# Patient Record
Sex: Female | Born: 1970
Health system: Southern US, Community
[De-identification: ages and names within clinical notes are randomized; demographics above are authoritative.]

## PROBLEM LIST (undated history)

## (undated) ENCOUNTER — Inpatient Hospital Stay (HOSPITAL_COMMUNITY): Payer: Self-pay

## (undated) DIAGNOSIS — E049 Nontoxic goiter, unspecified: Secondary | ICD-10-CM

## (undated) DIAGNOSIS — L910 Hypertrophic scar: Secondary | ICD-10-CM

## (undated) DIAGNOSIS — O139 Gestational [pregnancy-induced] hypertension without significant proteinuria, unspecified trimester: Secondary | ICD-10-CM

## (undated) DIAGNOSIS — B977 Papillomavirus as the cause of diseases classified elsewhere: Secondary | ICD-10-CM

## (undated) DIAGNOSIS — N390 Urinary tract infection, site not specified: Secondary | ICD-10-CM

## (undated) HISTORY — DX: Papillomavirus as the cause of diseases classified elsewhere: B97.7

---

## 2007-01-06 DIAGNOSIS — B977 Papillomavirus as the cause of diseases classified elsewhere: Secondary | ICD-10-CM

## 2007-01-06 HISTORY — DX: Papillomavirus as the cause of diseases classified elsewhere: B97.7

## 2009-12-17 ENCOUNTER — Emergency Department: Payer: Self-pay | Admitting: Emergency Medicine

## 2010-02-06 ENCOUNTER — Other Ambulatory Visit: Payer: Self-pay | Admitting: Otolaryngology

## 2010-02-06 DIAGNOSIS — E049 Nontoxic goiter, unspecified: Secondary | ICD-10-CM

## 2010-02-10 ENCOUNTER — Ambulatory Visit
Admission: RE | Admit: 2010-02-10 | Discharge: 2010-02-10 | Disposition: A | Discharge status: Home / Self Care | Payer: Managed Care, Other (non HMO) | Source: Ambulatory Visit | Attending: Otolaryngology | Admitting: Otolaryngology

## 2010-02-10 DIAGNOSIS — E049 Nontoxic goiter, unspecified: Secondary | ICD-10-CM

## 2010-02-10 MED ORDER — IOHEXOL 300 MG/ML  SOLN
75.0000 mL | Freq: Once | INTRAMUSCULAR | Status: AC | PRN
  Administered 2010-02-10: 75 mL via INTRAVENOUS

## 2010-07-10 ENCOUNTER — Inpatient Hospital Stay (INDEPENDENT_AMBULATORY_CARE_PROVIDER_SITE_OTHER)
Admission: RE | Admit: 2010-07-10 | Discharge: 2010-07-10 | Disposition: A | Discharge status: Home / Self Care | Payer: Managed Care, Other (non HMO) | Source: Ambulatory Visit | Attending: Family Medicine | Admitting: Family Medicine

## 2010-07-10 DIAGNOSIS — K219 Gastro-esophageal reflux disease without esophagitis: Secondary | ICD-10-CM

## 2011-01-19 ENCOUNTER — Ambulatory Visit: Payer: Managed Care, Other (non HMO) | Admitting: Family Medicine

## 2011-01-27 ENCOUNTER — Inpatient Hospital Stay (HOSPITAL_COMMUNITY)
Admission: AD | Admit: 2011-01-27 | Discharge: 2011-01-27 | Disposition: A | Discharge status: Home / Self Care | Payer: BC Managed Care – PPO | Source: Ambulatory Visit | Attending: Obstetrics & Gynecology | Admitting: Obstetrics & Gynecology

## 2011-01-27 ENCOUNTER — Inpatient Hospital Stay (HOSPITAL_COMMUNITY): Payer: BC Managed Care – PPO

## 2011-01-27 ENCOUNTER — Encounter (HOSPITAL_COMMUNITY): Payer: Self-pay | Admitting: *Deleted

## 2011-01-27 DIAGNOSIS — O209 Hemorrhage in early pregnancy, unspecified: Secondary | ICD-10-CM | POA: Insufficient documentation

## 2011-01-27 DIAGNOSIS — N760 Acute vaginitis: Secondary | ICD-10-CM | POA: Insufficient documentation

## 2011-01-27 DIAGNOSIS — O4690 Antepartum hemorrhage, unspecified, unspecified trimester: Secondary | ICD-10-CM

## 2011-01-27 DIAGNOSIS — O2390 Unspecified genitourinary tract infection in pregnancy, unspecified trimester: Secondary | ICD-10-CM | POA: Insufficient documentation

## 2011-01-27 DIAGNOSIS — B9689 Other specified bacterial agents as the cause of diseases classified elsewhere: Secondary | ICD-10-CM | POA: Insufficient documentation

## 2011-01-27 DIAGNOSIS — A499 Bacterial infection, unspecified: Secondary | ICD-10-CM | POA: Insufficient documentation

## 2011-01-27 HISTORY — DX: Nontoxic goiter, unspecified: E04.9

## 2011-01-27 HISTORY — DX: Hypertrophic scar: L91.0

## 2011-01-27 HISTORY — DX: Gestational (pregnancy-induced) hypertension without significant proteinuria, unspecified trimester: O13.9

## 2011-01-27 HISTORY — DX: Urinary tract infection, site not specified: N39.0

## 2011-01-27 LAB — WET PREP, GENITAL: Trich, Wet Prep: NONE SEEN

## 2011-01-27 LAB — URINALYSIS, ROUTINE W REFLEX MICROSCOPIC
Bilirubin Urine: NEGATIVE
Glucose, UA: NEGATIVE mg/dL
Ketones, ur: NEGATIVE mg/dL
Leukocytes, UA: NEGATIVE
Protein, ur: NEGATIVE mg/dL
pH: 6 (ref 5.0–8.0)

## 2011-01-27 LAB — ABO/RH: ABO/RH(D): O POS

## 2011-01-27 LAB — CBC
Hemoglobin: 12.3 g/dL (ref 12.0–15.0)
MCH: 26.8 pg (ref 26.0–34.0)
MCV: 82.8 fL (ref 78.0–100.0)
RBC: 4.59 MIL/uL (ref 3.87–5.11)
WBC: 6.3 10*3/uL (ref 4.0–10.5)

## 2011-01-27 LAB — URINE MICROSCOPIC-ADD ON

## 2011-01-27 MED ORDER — METRONIDAZOLE 500 MG PO TABS: 500.0000 mg | ORAL_TABLET | Freq: Two times a day (BID) | ORAL | Status: AC

## 2011-01-27 NOTE — Progress Notes (Signed)
+  HPT 01/13, spotting on the 12th , light pink spotting today, cramping this morning.

## 2011-01-27 NOTE — Progress Notes (Signed)
Pt states, " My last period was Dec 5th, and this morning I was cramping, but they have stopped now. I had light pink spotting at 5:00 am, but haven't seen it since. I had a positive home pregnancy test on Jan 18, 2011."

## 2011-01-27 NOTE — ED Provider Notes (Addendum)
History   Patient is a 41 year old, pregnant, G43P1102 female who presents to the MAU with chief complaint of spotting and abdominal cramping x 1 day.  Spotting and cramping began this morning at 5:00am.  Patient notices the spotting after wiping following urination.  She is not bleeding currently.  She was last sexually active 3 days ago.  The cramping is intermittent and located in her periumbilical region.  A warm shower improved her cramps this morning; she has not taken any pain relieving medications.  She has no history of irregular menstrual or vaginal bleeding, abnormal pap smears, or STIs.  She denies chest pain, shortness of breath, vomiting, dysuria, hematuria, diarrhea and blood in her stool.   Chief Complaint  Patient presents with  . Vaginal Bleeding  . Abdominal Cramping   HPI  Past Medical History  Diagnosis Date  . Pregnancy induced hypertension   . Goiter   . Urinary tract infection   . Keloid     Past Surgical History  Procedure Date  . Cesarean section     Family History  Problem Relation Age of Onset  . Anesthesia problems Neg Hx     History  Substance Use Topics  . Smoking status: Never Smoker   . Smokeless tobacco: Never Used  . Alcohol Use: No    Allergies: No Known Allergies  No prescriptions prior to admission    Review of Systems  Constitutional: Positive for chills and malaise/fatigue. Negative for fever.  Respiratory: Negative for shortness of breath.   Cardiovascular: Negative for chest pain.  Gastrointestinal: Positive for nausea and abdominal pain (periumbilical). Negative for vomiting, diarrhea and blood in stool.  Genitourinary: Negative for dysuria and hematuria.  Musculoskeletal: Negative for back pain.   Physical Exam   Blood pressure 117/67, pulse 83, temperature 98.4 F (36.9 C), temperature source Oral, resp. rate 18, height 5' 4.5" (1.638 m), weight 119.806 kg (264 lb 2 oz), last menstrual period 12/10/2010.  Physical Exam    Constitutional: She is oriented to person, place, and time. She appears well-developed and well-nourished.  HENT:  Head: Normocephalic and atraumatic.  Cardiovascular: Normal rate, regular rhythm and normal heart sounds.   No murmur heard. Respiratory: No respiratory distress. She has no wheezes.  GI: Soft. Bowel sounds are normal. She exhibits no mass. There is tenderness (mild periumbilical). There is no rebound and no guarding.  Neurological: She is alert and oriented to person, place, and time.  Psychiatric: She has a normal mood and affect. Her behavior is normal.   Results for orders placed during the hospital encounter of 01/27/11 (from the past 24 hour(s))  URINALYSIS, ROUTINE W REFLEX MICROSCOPIC     Status: Abnormal   Collection Time   01/27/11 11:50 AM      Component Value Range   Color, Urine YELLOW  YELLOW    APPearance CLEAR  CLEAR    Specific Gravity, Urine 1.025  1.005 - 1.030    pH 6.0  5.0 - 8.0    Glucose, UA NEGATIVE  NEGATIVE (mg/dL)   Hgb urine dipstick SMALL (*) NEGATIVE    Bilirubin Urine NEGATIVE  NEGATIVE    Ketones, ur NEGATIVE  NEGATIVE (mg/dL)   Protein, ur NEGATIVE  NEGATIVE (mg/dL)   Urobilinogen, UA 0.2  0.0 - 1.0 (mg/dL)   Nitrite NEGATIVE  NEGATIVE    Leukocytes, UA NEGATIVE  NEGATIVE   URINE MICROSCOPIC-ADD ON     Status: Abnormal   Collection Time   01/27/11 11:50  AM      Component Value Range   Squamous Epithelial / LPF FEW (*) RARE    RBC / HPF 3-6  <3 (RBC/hpf)  POCT PREGNANCY, URINE     Status: Normal   Collection Time   01/27/11 12:16 PM      Component Value Range   Preg Test, Ur POSITIVE    HCG, QUANTITATIVE, PREGNANCY     Status: Abnormal   Collection Time   01/27/11 12:40 PM      Component Value Range   hCG, Beta Chain, Quant, S 82956 (*) <5 (mIU/mL)  CBC     Status: Normal   Collection Time   01/27/11 12:41 PM      Component Value Range   WBC 6.3  4.0 - 10.5 (K/uL)   RBC 4.59  3.87 - 5.11 (MIL/uL)   Hemoglobin 12.3  12.0 -  15.0 (g/dL)   HCT 21.3  08.6 - 57.8 (%)   MCV 82.8  78.0 - 100.0 (fL)   MCH 26.8  26.0 - 34.0 (pg)   MCHC 32.4  30.0 - 36.0 (g/dL)   RDW 46.9  62.9 - 52.8 (%)   Platelets 268  150 - 400 (K/uL)  ABO/RH     Status: Normal   Collection Time   01/27/11 12:41 PM      Component Value Range   ABO/RH(D) O POS    WET PREP, GENITAL     Status: Abnormal   Collection Time   01/27/11  1:30 PM      Component Value Range   Yeast, Wet Prep NONE SEEN  NONE SEEN    Trich, Wet Prep NONE SEEN  NONE SEEN    Clue Cells, Wet Prep FEW (*) NONE SEEN    WBC, Wet Prep HPF POC MANY (*) NONE SEEN      MAU Course  Procedures U/A Pelvic exam with wet prep, GC/Chlamydia U/S  Assessment and Plan  I sign over this patient's care to Sharen Counter, CNM, at 1:31pm on January 27, 2011.    Derek Jack 01/27/2011, 1:22 PM   A: Vaginal bleeding of pregnancy Bacterial vaginosis  P: D/C home with bleeding precautions Pt has scheduled prenatal visit in 1 week in [place].  Prescription for Flagyl 500mg  BID for 7 days #14 no refills called to [facility] [address] [phone]  Sharen Counter, CNM

## 2011-01-28 ENCOUNTER — Telehealth: Payer: Self-pay | Admitting: *Deleted

## 2011-01-28 DIAGNOSIS — B9689 Other specified bacterial agents as the cause of diseases classified elsewhere: Secondary | ICD-10-CM

## 2011-01-28 DIAGNOSIS — N760 Acute vaginitis: Secondary | ICD-10-CM

## 2011-01-28 MED ORDER — METRONIDAZOLE 0.75 % VA GEL: 1.0000 | Freq: Every day | VAGINAL | Status: AC

## 2011-01-28 MED ORDER — METRONIDAZOLE 0.75 % VA GEL: 1.0000 | Freq: Every day | VAGINAL | Status: DC

## 2011-01-28 NOTE — Telephone Encounter (Signed)
Patient unable to take metronidazole, it made her very nauseated and dizzy.  We will call in Metrogel Vaginal to treat bacterial infection.

## 2011-01-28 NOTE — Telephone Encounter (Signed)
Patient changed her mind and would like [facility] [place].

## 2011-01-29 ENCOUNTER — Ambulatory Visit (INDEPENDENT_AMBULATORY_CARE_PROVIDER_SITE_OTHER): Payer: BC Managed Care – PPO | Admitting: Family Medicine

## 2011-01-29 VITALS — BP 131/81 | Wt 268.0 lb

## 2011-01-29 DIAGNOSIS — O200 Threatened abortion: Secondary | ICD-10-CM

## 2011-01-29 NOTE — Progress Notes (Signed)
Patient is here for New OB intake she was seen at MAU for spotting and was told there was a small hemorrhage.  She has had some more light spotting today, only when she wipes.  On ultrasound I am unable to clearly see a fetal pole.  Today I included HCG quant on her labs and because she has been spotting off and on I advised her of what to do if she begins to bleed more heavily.  She understands and will call us Monday for results.  She has been feeling cramping and nausea so I have given her a note for work until Monday at which point we will re evaluate her pregnancy status.  She had a quant drawn at the hospital so we will be able to see if the numbers are moving up or down.  She is interested in having her tubes tied if this pregnancy fails.

## 2011-01-30 LAB — OBSTETRIC PANEL
Antibody Screen: NEGATIVE
Basophils Absolute: 0 10*3/uL (ref 0.0–0.1)
Basophils Relative: 0 % (ref 0–1)
Eosinophils Absolute: 0 10*3/uL (ref 0.0–0.7)
Eosinophils Relative: 0 % (ref 0–5)
HCT: 35.6 % — ABNORMAL LOW (ref 36.0–46.0)
Hemoglobin: 12 g/dL (ref 12.0–15.0)
MCH: 27.8 pg (ref 26.0–34.0)
MCHC: 33.7 g/dL (ref 30.0–36.0)
Monocytes Absolute: 0.5 10*3/uL (ref 0.1–1.0)
Monocytes Relative: 7 % (ref 3–12)
Neutro Abs: 3.6 10*3/uL (ref 1.7–7.7)
RDW: 14.6 % (ref 11.5–15.5)
Rh Type: POSITIVE

## 2011-01-30 LAB — HIV ANTIBODY (ROUTINE TESTING W REFLEX): HIV: NONREACTIVE

## 2011-01-30 LAB — HCG, QUANTITATIVE, PREGNANCY: hCG, Beta Chain, Quant, S: 55290.8 m[IU]/mL

## 2011-02-02 ENCOUNTER — Telehealth: Payer: Self-pay | Admitting: *Deleted

## 2011-02-02 DIAGNOSIS — O200 Threatened abortion: Secondary | ICD-10-CM

## 2011-02-02 NOTE — Telephone Encounter (Signed)
Ultrasound per Dr. Penne Lash.

## 2011-02-03 ENCOUNTER — Ambulatory Visit (HOSPITAL_COMMUNITY)
Admission: RE | Admit: 2011-02-03 | Discharge: 2011-02-03 | Disposition: A | Discharge status: Home / Self Care | Payer: BC Managed Care – PPO | Source: Ambulatory Visit | Attending: Obstetrics and Gynecology | Admitting: Obstetrics and Gynecology

## 2011-02-03 ENCOUNTER — Encounter: Payer: Self-pay | Admitting: Obstetrics and Gynecology

## 2011-02-03 DIAGNOSIS — Z8751 Personal history of pre-term labor: Secondary | ICD-10-CM | POA: Insufficient documentation

## 2011-02-03 DIAGNOSIS — O34219 Maternal care for unspecified type scar from previous cesarean delivery: Secondary | ICD-10-CM | POA: Insufficient documentation

## 2011-02-03 DIAGNOSIS — O021 Missed abortion: Secondary | ICD-10-CM | POA: Insufficient documentation

## 2011-02-03 DIAGNOSIS — E669 Obesity, unspecified: Secondary | ICD-10-CM | POA: Insufficient documentation

## 2011-02-03 DIAGNOSIS — O99210 Obesity complicating pregnancy, unspecified trimester: Secondary | ICD-10-CM | POA: Insufficient documentation

## 2011-02-03 DIAGNOSIS — O200 Threatened abortion: Secondary | ICD-10-CM

## 2011-02-03 DIAGNOSIS — O209 Hemorrhage in early pregnancy, unspecified: Secondary | ICD-10-CM | POA: Insufficient documentation

## 2011-02-03 NOTE — Progress Notes (Signed)
Called by radiology to inform patient of missed Ab. Patient reports experiencing vaginal spotting and cramping since 01/27/2011. Results of ultrasound reviewed which is suggestive of a missed Ab. Options of expectant vs medical management explained to the patient. Patient opted for medical management. Rx 1000 mcg Cytotec per vagina at bedtime was provided. Patient advised to return to [facility] office on Monday for f/u quant HCG or if she did not experience any vaginal bleeding, to be scheduled for D&C.  Patient verbalized understanding and all questions were answered. Patient also received  Rx 20 tab of percocet (5/325mg )

## 2011-02-09 ENCOUNTER — Ambulatory Visit (INDEPENDENT_AMBULATORY_CARE_PROVIDER_SITE_OTHER): Payer: BC Managed Care – PPO | Admitting: Obstetrics & Gynecology

## 2011-02-09 ENCOUNTER — Encounter: Payer: Self-pay | Admitting: Obstetrics & Gynecology

## 2011-02-09 VITALS — BP 139/91 | HR 76 | Ht 65.0 in | Wt 269.0 lb

## 2011-02-09 DIAGNOSIS — O021 Missed abortion: Secondary | ICD-10-CM

## 2011-02-09 MED ORDER — OXYCODONE-ACETAMINOPHEN 5-325 MG PO TABS: 1.0000 | ORAL_TABLET | Freq: Four times a day (QID) | ORAL | Status: AC | PRN

## 2011-02-09 NOTE — Progress Notes (Signed)
History:  41 y.o. [id number] here today for follow up of missed abortion that was managed medically on 02/03/11.  Reports passing the pregnancy with a lot of blood, she is wondering if she is anemia.  Denies LH, dizziness or other symptoms.  Still has light bleeding which is expected and severe cramping not alleviated by Ibuprofen, she desires Percocet Rx. No sexual intercourse yet as recommended, undecided about birth control.  Last pap was over 3 years ago.  The following portions of the patient's history were reviewed and updated as appropriate: allergies, current medications, past family history, past medical history, past social history, past surgical history and problem list.   Objective:  Physical Exam Blood pressure 139/91, pulse 76, height 5\' 5"  (1.651 m), weight 269 lb (122.018 kg), last menstrual period 12/10/2010, unknown if currently breastfeeding. Gen: NAD Abd: Soft, nontender and non-distended, obese Pelvic: Deferred   Assessment & Plan:  MAB, medically managed, completed as per patient report.  Will check BHCG and CBC and manage appropriately. Percocet Rx given to patient. Schedule for annual exam soon. Reviewed all forms of birth control options available including abstinence; over the counter methods; oral contraceptive medication including pill, patch, or ring; Depo-Provera injection; Mirena and Paragard IUDs; permanent sterilization options including vasectomy, tubal ligation, or tubal occlusion using Essure coils. Risks and benefits reviewed.  Questions were answered.  Information was given to patient to review. Patient still undecided,  will re-discuss BCM at next visit.

## 2011-02-10 LAB — CBC
MCV: 83.8 fL (ref 78.0–100.0)
Platelets: 292 10*3/uL (ref 150–400)
RBC: 4.2 MIL/uL (ref 3.87–5.11)
RDW: 14.6 % (ref 11.5–15.5)
WBC: 6.3 10*3/uL (ref 4.0–10.5)

## 2011-02-10 LAB — HCG, QUANTITATIVE, PREGNANCY: hCG, Beta Chain, Quant, S: 1464.2 m[IU]/mL

## 2011-02-12 ENCOUNTER — Ambulatory Visit: Payer: Managed Care, Other (non HMO) | Admitting: Family Medicine

## 2011-02-18 ENCOUNTER — Other Ambulatory Visit (INDEPENDENT_AMBULATORY_CARE_PROVIDER_SITE_OTHER): Payer: BC Managed Care – PPO

## 2011-02-18 DIAGNOSIS — O021 Missed abortion: Secondary | ICD-10-CM

## 2011-02-19 LAB — HCG, QUANTITATIVE, PREGNANCY: hCG, Beta Chain, Quant, S: 288.4 m[IU]/mL

## 2011-03-04 ENCOUNTER — Other Ambulatory Visit: Payer: BC Managed Care – PPO

## 2011-03-04 ENCOUNTER — Other Ambulatory Visit (INDEPENDENT_AMBULATORY_CARE_PROVIDER_SITE_OTHER): Payer: BC Managed Care – PPO | Admitting: *Deleted

## 2011-03-04 DIAGNOSIS — O021 Missed abortion: Secondary | ICD-10-CM

## 2011-03-05 LAB — HCG, QUANTITATIVE, PREGNANCY: hCG, Beta Chain, Quant, S: 12.4 m[IU]/mL

## 2011-03-16 ENCOUNTER — Other Ambulatory Visit (INDEPENDENT_AMBULATORY_CARE_PROVIDER_SITE_OTHER): Payer: BC Managed Care – PPO | Admitting: *Deleted

## 2011-03-16 DIAGNOSIS — O021 Missed abortion: Secondary | ICD-10-CM

## 2011-03-16 NOTE — Progress Notes (Signed)
Patient is here for HCG quant due to missed ab.

## 2011-03-18 LAB — HCG, QUANTITATIVE, PREGNANCY: hCG, Beta Chain, Quant, S: 2.6 m[IU]/mL

## 2011-06-25 ENCOUNTER — Ambulatory Visit (INDEPENDENT_AMBULATORY_CARE_PROVIDER_SITE_OTHER): Payer: BC Managed Care – PPO | Admitting: Family Medicine

## 2011-06-25 ENCOUNTER — Encounter: Payer: Self-pay | Admitting: Family Medicine

## 2011-06-25 VITALS — BP 120/70 | HR 80 | Temp 98.0°F | Ht 64.5 in | Wt 263.0 lb

## 2011-06-25 DIAGNOSIS — E049 Nontoxic goiter, unspecified: Secondary | ICD-10-CM

## 2011-06-25 DIAGNOSIS — E669 Obesity, unspecified: Secondary | ICD-10-CM

## 2011-06-25 DIAGNOSIS — Z0000 Encounter for general adult medical examination without abnormal findings: Secondary | ICD-10-CM

## 2011-06-25 DIAGNOSIS — Z136 Encounter for screening for cardiovascular disorders: Secondary | ICD-10-CM

## 2011-06-25 LAB — COMPREHENSIVE METABOLIC PANEL
ALT: 11 U/L (ref 0–35)
Alkaline Phosphatase: 59 U/L (ref 39–117)
CO2: 25 mEq/L (ref 19–32)
Creatinine, Ser: 0.8 mg/dL (ref 0.4–1.2)
GFR: 101.1 mL/min (ref >60.00)
Sodium: 139 mEq/L (ref 135–145)
Total Bilirubin: 0 mg/dL — ABNORMAL LOW (ref 0.3–1.2)
Total Protein: 7.3 g/dL (ref 6.0–8.3)

## 2011-06-25 LAB — LIPID PANEL
HDL: 53.5 mg/dL (ref >39.00)
LDL Cholesterol: 83 mg/dL (ref 0–99)
VLDL: 27.8 mg/dL (ref 0.0–40.0)

## 2011-06-25 LAB — TSH: TSH: 0.4 u[IU]/mL (ref 0.35–5.50)

## 2011-06-25 LAB — T4, FREE: Free T4: 0.79 ng/dL (ref 0.60–1.60)

## 2011-06-25 MED ORDER — PHENTERMINE HCL 15 MG PO CAPS: 15.0000 mg | ORAL_CAPSULE | ORAL | Status: DC

## 2011-06-25 NOTE — Progress Notes (Signed)
Subjective:    Patient ID: Gloria Flynn, female    DOB: [birth year], 41 y.o.   MRN: [id number]  HPI  41 yo here to establish care.  Overdue for preventative care, she would prefer to schedule a CPX at a later date.  Goiter- getting progressively larger over past 3 years.  No difficulty swallowing.  Thyroid studies have been normal per pt.  She is followed by Dr. Artist Pais in [place].  Will be scheduling thyroid surgery at her convenience.  Obesity- continues to gain weight.  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  Trying to exercise and cut back.  Tends to snack in the evenings.  No h/o HTN or palpitations.  Patient Active Problem List  Diagnosis  . Missed abortion  . Obesity  . Goiter   Past Medical History  Diagnosis Date  . Pregnancy induced hypertension   . Urinary tract infection   . Keloid   . Goiter     Dr. Artist Pais in [facility]   Past Surgical History  Procedure Date  . Cesarean section    History  Substance Use Topics  . Smoking status: Never Smoker   . Smokeless tobacco: Never Used  . Alcohol Use: No   Family History  Problem Relation Age of Onset  . Anesthesia problems Neg Hx   . Diabetes Father   . Heart disease Father    No Known Allergies Current Outpatient Prescriptions on File Prior to Visit  Medication Sig Dispense Refill  . phentermine 15 MG capsule Take 1 capsule (15 mg total) by mouth every morning.  30 capsule  0   The PMH, PSH, Social History, Family History, Medications, and allergies have been reviewed in [facility], and have been updated if relevant.   Review of Systems See HPI Patient reports no  vision/ hearing changes,anorexia, swallowing issues, chest pain, edema,persistant / recurrent cough, hemoptysis, dyspnea(rest, exertional, paroxysmal nocturnal), gastrointestinal  bleeding (melena, rectal bleeding), abdominal pain    Objective:   Physical Exam BP 120/70  Pulse 80  Temp 98 F (36.7 C) (Oral)  Ht 5' 4.5" (1.638  m)  Wt 263 lb (119.296 kg)  BMI 44.45 kg/m2  LMP 12/10/2010  General:  obese,well-nourished,in no acute distress; alert,appropriate and cooperative throughout examination Head:  normocephalic and atraumatic.   Eyes:  vision grossly intact, pupils equal, pupils round, and pupils reactive to light.   Ears:  R ear normal and L ear normal.   Nose:  no external deformity.   Mouth:  good dentition.   Neck: large nontender goiter Lungs:  Normal respiratory effort, chest expands symmetrically. Lungs are clear to auscultation, no crackles or wheezes. Heart:  Normal rate and regular rhythm. S1 and S2 normal without gallop, murmur, click, rub or other extra sounds. Neurologic:  alert & oriented X3 and gait normal.   Skin:  Intact without suspicious lesions or rashes Cervical Nodes:  No lymphadenopathy noted Axillary Nodes:  No palpable lymphadenopathy Psych:  Cognition and judgment appear intact. Alert and cooperative with normal attention span and concentration. No apparent delusions, illusions, hallucinations     Assessment & Plan:   1. Obesity  Deteriorated.  Counseled on risks of phentermine, including HTN, pulmonary HTN, stroke.  Pt would like to try.  Husband is using condoms to prevent pregnancy. Discussed that it is unsafe during pregnancy.  She would like to start phentermine. Follow up in 1 month.   2. Goiter  Deteriorated. Recheck thyroid function. Awaiting records  from Dr. Artist Pais, ENT. TSH, T4, Free

## 2011-06-25 NOTE — Patient Instructions (Addendum)
We are starting phentermine- call me immediately if you have any symptoms we discussed. Follow up in 1 month. At your convenience, schedule a complete physical and pap smear. We will call you with your lab results.

## 2011-07-31 ENCOUNTER — Encounter: Payer: Self-pay | Admitting: *Deleted

## 2011-07-31 ENCOUNTER — Encounter: Payer: Self-pay | Admitting: Family Medicine

## 2011-07-31 ENCOUNTER — Ambulatory Visit: Payer: BC Managed Care – PPO | Admitting: Family Medicine

## 2011-11-02 ENCOUNTER — Ambulatory Visit: Payer: BC Managed Care – PPO | Admitting: Family Medicine

## 2011-11-13 ENCOUNTER — Ambulatory Visit: Payer: BC Managed Care – PPO | Admitting: Family Medicine

## 2011-11-16 ENCOUNTER — Encounter: Payer: Self-pay | Admitting: Family Medicine

## 2011-12-10 ENCOUNTER — Encounter: Payer: Self-pay | Admitting: Family Medicine

## 2011-12-10 ENCOUNTER — Ambulatory Visit (INDEPENDENT_AMBULATORY_CARE_PROVIDER_SITE_OTHER): Payer: BC Managed Care – PPO | Admitting: Family Medicine

## 2011-12-10 VITALS — BP 128/82 | HR 78 | Temp 97.9°F

## 2011-12-10 DIAGNOSIS — J40 Bronchitis, not specified as acute or chronic: Secondary | ICD-10-CM

## 2011-12-10 MED ORDER — HYDROCODONE-HOMATROPINE 5-1.5 MG/5ML PO SYRP: 5.0000 mL | ORAL_SOLUTION | Freq: Three times a day (TID) | ORAL | Status: DC | PRN

## 2011-12-10 MED ORDER — AZITHROMYCIN 250 MG PO TABS: ORAL_TABLET | ORAL | Status: DC

## 2011-12-10 NOTE — Patient Instructions (Signed)
Sounds like you have a bronchitis. Take zpack as directed.  Use hycodan cough syrup for night time. Push fluids and plenty of rest. Out of work for tomorrow. May use over the counter robitussin or delsym during the day.  Hycodan at night. Please return if you are not improving as expected, or if you have high fevers (>101.5) or difficulty swallowing or worsening productive cough. Call clinic with questions.  Good to see you today.

## 2011-12-10 NOTE — Assessment & Plan Note (Signed)
Given progression and deterioration of sxs, will treat for bacterial bronchitis with zpack. Provided with hycodan for cough at night, discussed sedation precautions and to not drive on this medicine. Update [place] if worsening instead of improving.

## 2011-12-10 NOTE — Progress Notes (Signed)
  Subjective:    Patient ID: Gloria Flynn, female    DOB: 06/17/1970, 41 y.o.   MRN: [id number]  HPI CC: ?bronchitis  8d h/o cough, currently dry cough.  Occasional productive mucous.  Today is worse day so far.  Worse at night as well, trouble sleeping 2/2 cough.  + body aches and chills.  + chest soreness with coughing.  + PNdrainage.  Feels chest congestion.  No documented fevers, abd pain, nausea, ear or tooth pain, ST.  + sick contacts at home. Husband smokes outside No h/o asthma/COPD.  Has tried tylenol cold/flu, nyquil, mucinex  Past Medical History  Diagnosis Date  . Pregnancy induced hypertension   . Urinary tract infection   . Keloid   . Goiter     Dr. Artist Pais in [facility]     Review of Systems Per HPI    Objective:   Physical Exam  Nursing note and vitals reviewed. Constitutional: She appears well-developed and well-nourished. No distress.  HENT:  Head: Normocephalic and atraumatic.  Right Ear: Hearing, tympanic membrane, external ear and ear canal normal.  Left Ear: Hearing, tympanic membrane, external ear and ear canal normal.  Nose: No mucosal edema or rhinorrhea. Right sinus exhibits frontal sinus tenderness (mild). Right sinus exhibits no maxillary sinus tenderness. Left sinus exhibits no maxillary sinus tenderness and no frontal sinus tenderness.  Mouth/Throat: Uvula is midline and mucous membranes are normal. Posterior oropharyngeal erythema present. No oropharyngeal exudate or posterior oropharyngeal edema.  Eyes: Conjunctivae normal and EOM are normal. Pupils are equal, round, and reactive to light. No scleral icterus.  Neck: Normal range of motion. Neck supple. Thyromegaly present.  Cardiovascular: Normal rate, regular rhythm, normal heart sounds and intact distal pulses.   No murmur heard. Pulmonary/Chest: Effort normal. No respiratory distress. She has decreased breath sounds (bibasilar). She has no wheezes. She has rhonchi (and crackles). She has no  rales.       Nagging rattling cough present  Musculoskeletal: She exhibits no edema.  Lymphadenopathy:    She has no cervical adenopathy.  Skin: Skin is warm and dry. No rash noted.       Assessment & Plan:

## 2011-12-14 ENCOUNTER — Encounter: Payer: Self-pay | Admitting: Family Medicine

## 2011-12-14 ENCOUNTER — Ambulatory Visit (INDEPENDENT_AMBULATORY_CARE_PROVIDER_SITE_OTHER): Payer: BC Managed Care – PPO | Admitting: Family Medicine

## 2011-12-14 VITALS — BP 118/90 | HR 96 | Temp 98.1°F | Wt 259.0 lb

## 2011-12-14 DIAGNOSIS — J40 Bronchitis, not specified as acute or chronic: Secondary | ICD-10-CM

## 2011-12-14 MED ORDER — CEFTRIAXONE SODIUM 1 G IJ SOLR
1.0000 g | Freq: Once | INTRAMUSCULAR | Status: AC
  Administered 2011-12-14: 1 g via INTRAMUSCULAR

## 2011-12-14 MED ORDER — ALBUTEROL SULFATE HFA 108 (90 BASE) MCG/ACT IN AERS: 2.0000 | INHALATION_SPRAY | Freq: Four times a day (QID) | RESPIRATORY_TRACT | Status: DC | PRN

## 2011-12-14 MED ORDER — DEXAMETHASONE SOD PHOSPHATE PF 10 MG/ML IJ SOLN
10.0000 mg | Freq: Once | INTRAMUSCULAR | Status: AC
  Administered 2011-12-14: 10 mg via INTRAMUSCULAR

## 2011-12-14 MED ORDER — BENZONATATE 200 MG PO CAPS: 200.0000 mg | ORAL_CAPSULE | Freq: Two times a day (BID) | ORAL | Status: DC | PRN

## 2011-12-14 MED ORDER — HYDROCODONE-HOMATROPINE 5-1.5 MG/5ML PO SYRP: 5.0000 mL | ORAL_SOLUTION | Freq: Three times a day (TID) | ORAL | Status: DC | PRN

## 2011-12-14 NOTE — Patient Instructions (Addendum)
Good to see you. Take Tessalon during the day, continue Hycodan at night.  Please call me tomorrow with an update.  Please only take Tylenol (no ibuprofen or alleve) for next 72 hours.

## 2011-12-14 NOTE — Addendum Note (Signed)
Addended by: Eliezer Bottom on: 12/14/2011 04:33 PM   Modules accepted: Orders

## 2011-12-14 NOTE — Progress Notes (Signed)
  Subjective:    Patient ID: Gloria Flynn, female    DOB: [birth year], 41 y.o.   MRN: [id number]  HPI 41 yo here for follow up bronchitis.  Saw Dr. Reece Agar 4 days ago- note reviewed.  At that time, had 8d h/o cough, currently dry cough.  Occasional productive mucous with body aches and chills.  Felt it was consistent with bacterial bronchitis and sinusitis, given Zpack and Hycodan.    Now actually feels worse- coughing so hard she often vomits or loses control of her bladder.  Hycodan helps but she can only take at night. Still having chills.  Throat still hurts and still has sinus pressure.   Past Medical History  Diagnosis Date  . Pregnancy induced hypertension   . Urinary tract infection   . Keloid   . Goiter     Dr. Artist Pais in [facility]     Review of Systems Per HPI    Objective:   Physical Exam  BP 118/90  Pulse 96  Temp 98.1 F (36.7 C)  Wt 259 lb (117.482 kg)  SpO2 99%  LMP 11/23/2011  Nursing note and vitals reviewed. Constitutional: She appears well-developed and well-nourished. No distress.  HENT:  Head: Normocephalic and atraumatic.  Right Ear: Hearing, tympanic membrane, external ear and ear canal normal.  Left Ear: Hearing, tympanic membrane, external ear and ear canal normal.  Nose: No mucosal edema or rhinorrhea. Right sinus exhibits frontal sinus tenderness (mild). Right sinus exhibits no maxillary sinus tenderness. Left sinus exhibits no maxillary sinus tenderness and no frontal sinus tenderness.  Mouth/Throat: Uvula is midline and mucous membranes are normal. Posterior oropharyngeal erythema present. No oropharyngeal exudate or posterior oropharyngeal edema.  Eyes: Conjunctivae normal and EOM are normal. Pupils are equal, round, and reactive to light. No scleral icterus.  Neck: Normal range of motion. Neck supple. Thyromegaly present.  Cardiovascular: Normal rate, regular rhythm, normal heart sounds and intact distal pulses.   No murmur  heard. Pulmonary/Chest: Effort normal. No respiratory distress.  Pos exp wheezes with bilateral rhonchi      Nagging rattling cough present  Musculoskeletal: She exhibits no edema.  Lymphadenopathy:    She has no cervical adenopathy.  Skin: Skin is warm and dry. No rash noted.       Assessment & Plan:   1. Bronchitis    Deteriorated. Given IM decadron in office to help with inflammation.  Rx sent to pharmacy for pro air inhaler and tessalon perles to use during day. Given incidence of Macrolide resistance in the community, IM Ceftriaxone 1 gram given in office as well. She will call tomorrow with an update of her symptoms.

## 2012-09-23 ENCOUNTER — Encounter: Payer: Self-pay | Admitting: Family Medicine

## 2012-09-23 ENCOUNTER — Ambulatory Visit (INDEPENDENT_AMBULATORY_CARE_PROVIDER_SITE_OTHER): Payer: BC Managed Care – PPO | Admitting: Family Medicine

## 2012-09-23 VITALS — BP 122/82 | HR 77 | Temp 98.0°F | Wt 246.2 lb

## 2012-09-23 DIAGNOSIS — J40 Bronchitis, not specified as acute or chronic: Secondary | ICD-10-CM

## 2012-09-23 MED ORDER — ALBUTEROL SULFATE HFA 108 (90 BASE) MCG/ACT IN AERS: 2.0000 | INHALATION_SPRAY | Freq: Four times a day (QID) | RESPIRATORY_TRACT | Status: DC | PRN

## 2012-09-23 MED ORDER — AZITHROMYCIN 250 MG PO TABS: ORAL_TABLET | ORAL | Status: DC

## 2012-09-23 MED ORDER — BENZONATATE 200 MG PO CAPS: 200.0000 mg | ORAL_CAPSULE | Freq: Three times a day (TID) | ORAL | Status: DC | PRN

## 2012-09-23 NOTE — Progress Notes (Signed)
She usually only uses SABA when she gets a cold.  Sick for about 1 week.  Started with chills and fever.  Cough continues.  Sleep disrupted. Discolored mucous.  No ear pain, mild ST.  No rhinorrhea now, some better with mucinex.  Goiter noted, followed by MD in [facility].    Meds, vitals, and allergies reviewed.   ROS: See HPI.  Otherwise, noncontributory.  GEN: nad, alert and oriented HEENT: mucous membranes moist, tm w/o erythema, nasal exam w/o erythema, clear discharge noted,  OP with cobblestoning NECK: supple w/o LA, Goiter noted.  CV: rrr.   PULM: ctab, no inc wob EXT: no edema SKIN: no acute rash

## 2012-09-23 NOTE — Patient Instructions (Addendum)
Use the inhaler and tessalon for the cough.  Start the antibiotics and get some rest.   I would get a flu shot each fall.  Take care.

## 2012-09-25 NOTE — Assessment & Plan Note (Signed)
Presumed, now >1week, start abx and see instructions. Nontoxic.  F/u prn.  She agrees.

## 2012-09-26 ENCOUNTER — Telehealth: Payer: Self-pay

## 2012-09-26 MED ORDER — HYDROCODONE-HOMATROPINE 5-1.5 MG/5ML PO SYRP: 2.5000 mL | ORAL_SOLUTION | Freq: Three times a day (TID) | ORAL | Status: DC | PRN

## 2012-09-26 NOTE — Telephone Encounter (Signed)
Any other rx cough medicine could make her drowsy.  Please call in hycodan rx and give her a note for 9/22-9/23/14.  If the hycodan doesn't work or isn't tolerated, then she can use OTC mucinex.  Thanks.

## 2012-09-26 NOTE — Telephone Encounter (Signed)
Note printed and Patient advised.  Note left at front desk for pick up.

## 2012-09-26 NOTE — Telephone Encounter (Signed)
Thanks. Please give her a note for 9/22-9/23/14.

## 2012-09-26 NOTE — Telephone Encounter (Signed)
Ok, I will relay this message to the patient but she also is asking for a work note for today and tomorrow.  Please advise.  She has called 3 times this morning.

## 2012-09-26 NOTE — Telephone Encounter (Signed)
Medication phoned to pharmacy.  

## 2012-09-26 NOTE — Telephone Encounter (Signed)
Pt was seen 09/23/12; tessalon makes pt dizzy, drowsy and h/a; pt request different med for prod cough with green phlegm. No fever and pt is still taking antibiotic. [facility] [address]Pt request note for work for 09/26/12 and 09/27/12. Pt request cb.

## 2012-09-26 NOTE — Telephone Encounter (Signed)
Will route to Dr. Para March who saw pt.

## 2012-10-24 ENCOUNTER — Encounter: Payer: Self-pay | Admitting: Family Medicine

## 2012-10-24 ENCOUNTER — Ambulatory Visit (INDEPENDENT_AMBULATORY_CARE_PROVIDER_SITE_OTHER): Payer: BC Managed Care – PPO | Admitting: Family Medicine

## 2012-10-24 VITALS — BP 120/80 | HR 83 | Temp 98.4°F | Wt 240.0 lb

## 2012-10-24 DIAGNOSIS — J40 Bronchitis, not specified as acute or chronic: Secondary | ICD-10-CM

## 2012-10-24 MED ORDER — HYDROCODONE-HOMATROPINE 5-1.5 MG/5ML PO SYRP: 2.5000 mL | ORAL_SOLUTION | Freq: Three times a day (TID) | ORAL | Status: DC | PRN

## 2012-10-24 MED ORDER — AZITHROMYCIN 250 MG PO TABS: ORAL_TABLET | ORAL | Status: DC

## 2012-10-24 NOTE — Progress Notes (Signed)
Prev illness resolved except the cough had a "long tail" that was slowly improving. Son was sick recently, better now.  In meantime, she got sick again.  Fevers, aches, cough, sputum (brown).  No ear pain.  Rhinorrhea noted.  Stuffy.  No diarrhea, vomiting, rash. Some wheeze. Cough worse at night.  Using the inhaler helps with the cough/wheeze.  No ST.    Meds, vitals, and allergies reviewed.   ROS: See HPI.  Otherwise, noncontributory.  GEN: nad, alert and oriented HEENT: mucous membranes moist, tm w/o erythema, nasal exam w/o erythema, clear discharge noted,  OP with cobblestoning NECK: supple w/o LA CV: rrr.   PULM: ctab but diffusely coarse, no inc wob.  No wheeze at time of exam EXT: no edema SKIN: no acute rash

## 2012-10-24 NOTE — Assessment & Plan Note (Signed)
Restart zmax and hycodan, continue SABA and f/u prn.  Nontoxic. Okay for outpatient f/u.  Out of work in meantime.  She agrees.  Call back prn.  supportive tx o/w.

## 2012-10-24 NOTE — Patient Instructions (Signed)
Use the inhaler and cough medicine as needed.  Start the antibiotics today.  Take care.  Call back as needed.

## 2012-12-13 ENCOUNTER — Encounter: Payer: Self-pay | Admitting: Family Medicine

## 2012-12-13 ENCOUNTER — Ambulatory Visit (INDEPENDENT_AMBULATORY_CARE_PROVIDER_SITE_OTHER): Payer: BC Managed Care – PPO | Admitting: Family Medicine

## 2012-12-13 VITALS — BP 122/80 | HR 74 | Temp 98.4°F | Wt 231.5 lb

## 2012-12-13 DIAGNOSIS — M7712 Lateral epicondylitis, left elbow: Secondary | ICD-10-CM

## 2012-12-13 DIAGNOSIS — M7710 Lateral epicondylitis, unspecified elbow: Secondary | ICD-10-CM

## 2012-12-13 DIAGNOSIS — M25539 Pain in unspecified wrist: Secondary | ICD-10-CM

## 2012-12-13 NOTE — Assessment & Plan Note (Signed)
D/w pt ZO:XWRUEAV and tx.  Would use a cock up splint prn and then ROM exercises as tolerated.  She agrees.  NSAIDS with Gi caution, local ice. Fu prn.

## 2012-12-13 NOTE — Patient Instructions (Signed)
Get a tennis elbow strap and a left handed cock up wrist splint.  Wear both as much as possible.  Take ibuprofen with food and use an ice bag on the painful areas.  Take care.

## 2012-12-13 NOTE — Progress Notes (Signed)
Pre-visit discussion using our clinic review tool. No additional management support is needed unless otherwise documented below in the visit note.  L arm and wrist pain.  Started about 3 years ago.  Off and on pain.  Lateral elbow and dorsal wrist pain.  Lifting up hurts. Some pain with making a fist.  No recent trauma.  Taking advil with a little help.  No rash, no weakness.  Was not in a split recently.    Meds, vitals, and allergies reviewed.   ROS: See HPI.  Otherwise, noncontributory.  nad Normal ROM at the neck and L shoulder, L elbow and wrist.  L Lateral epicondyle ttp, esp with resisted ROM, improved with compression of the proximal L forearm L wrist with normal ROM but pain on dorsiflexion.  Not ttp on bony prominences.  Snuffbox not ttp No flexor pain.  No laxity at wrist or ROM deficit.  Distally NV intact

## 2012-12-13 NOTE — Assessment & Plan Note (Signed)
D/w pt ZO:XWRUEAV and tx.  Would use a strap prn and then ROM exercises as tolerated.  She agrees.  NSAIDS with Gi caution, local ice.

## 2013-01-16 ENCOUNTER — Ambulatory Visit: Payer: BC Managed Care – PPO | Admitting: Family Medicine

## 2013-05-09 ENCOUNTER — Encounter: Payer: Self-pay | Admitting: Obstetrics & Gynecology

## 2013-05-09 ENCOUNTER — Ambulatory Visit (INDEPENDENT_AMBULATORY_CARE_PROVIDER_SITE_OTHER): Payer: BC Managed Care – PPO | Admitting: Obstetrics & Gynecology

## 2013-05-09 VITALS — BP 125/84 | HR 74 | Ht 65.0 in | Wt 242.0 lb

## 2013-05-09 DIAGNOSIS — Z01419 Encounter for gynecological examination (general) (routine) without abnormal findings: Secondary | ICD-10-CM

## 2013-05-09 DIAGNOSIS — Z124 Encounter for screening for malignant neoplasm of cervix: Secondary | ICD-10-CM

## 2013-05-09 DIAGNOSIS — Z1151 Encounter for screening for human papillomavirus (HPV): Secondary | ICD-10-CM

## 2013-05-09 DIAGNOSIS — M25559 Pain in unspecified hip: Secondary | ICD-10-CM

## 2013-05-09 DIAGNOSIS — Z0000 Encounter for general adult medical examination without abnormal findings: Secondary | ICD-10-CM

## 2013-05-09 DIAGNOSIS — Z113 Encounter for screening for infections with a predominantly sexual mode of transmission: Secondary | ICD-10-CM

## 2013-05-09 LAB — CBC
HCT: 39.2 % (ref 36.0–46.0)
HEMOGLOBIN: 12.8 g/dL (ref 12.0–15.0)
MCH: 27.1 pg (ref 26.0–34.0)
MCHC: 32.7 g/dL (ref 30.0–36.0)
MCV: 82.9 fL (ref 78.0–100.0)
Platelets: 265 10*3/uL (ref 150–400)
RBC: 4.73 MIL/uL (ref 3.87–5.11)
RDW: 14.5 % (ref 11.5–15.5)
WBC: 3.7 10*3/uL — AB (ref 4.0–10.5)

## 2013-05-09 MED ORDER — NAPROXEN SODIUM 550 MG PO TABS: 550.0000 mg | ORAL_TABLET | Freq: Two times a day (BID) | ORAL | Status: DC | PRN

## 2013-05-09 NOTE — Progress Notes (Signed)
Left lower quadrant pelvic pain off and on for a month.  Patient has not had a pap since 2009 which she says she was diagnosed with HPV at this time.  This was done in [place].

## 2013-05-09 NOTE — Patient Instructions (Signed)
Thank you for enrolling in [facility]. Please follow the instructions below to securely access your online medical record. MyChart allows you to send messages to your doctor, view your test results, manage appointments, and more.   How Do I Sign Up? 1. In your Internet browser, go to [employer] and enter [email or web address]. 2. Click on the Sign Up Now link in the Sign In box. You will see the New Member Sign Up page. 3. Enter your MyChart Access Code exactly as it appears below. You will not need to use this code after you've completed the sign-up process. If you do not sign up before the expiration date, you must request a new code.  MyChart Access Code: [email or web address] Expires: 07/08/2013 10:19 AM  4. Enter your Social Security Number ([id number]) and Date of Birth (mm/dd/yyyy) as indicated and click Submit. You will be taken to the next sign-up page. 5. Create a MyChart ID. This will be your MyChart login ID and cannot be changed, so think of one that is secure and easy to remember. 6. Create a MyChart password. You can change your password at any time. 7. Enter your Password Reset Question and Answer. This can be used at a later time if you forget your password.  8. Enter your e-mail address. You will receive e-mail notification when new information is available in [facility]. 9. Click Sign Up. You can now view your medical record.   Additional Information Remember, MyChart is NOT to be used for urgent needs. For medical emergencies, dial 911.  Preventive Care for Adults, Female A healthy lifestyle and preventive care can promote health and wellness. Preventive health guidelines for women include the following key practices.  A routine yearly physical is a good way to check with your health care provider about your health and preventive screening. It is a chance to share any concerns and updates on your health and to receive a thorough exam.  Visit your dentist for a routine  exam and preventive care every 6 months. Brush your teeth twice a day and floss once a day. Good oral hygiene prevents tooth decay and gum disease.  The frequency of eye exams is based on your age, health, family medical history, use of contact lenses, and other factors. Follow your health care provider's recommendations for frequency of eye exams.  Eat a healthy diet. Foods like vegetables, fruits, whole grains, low-fat dairy products, and lean protein foods contain the nutrients you need without too many calories. Decrease your intake of foods high in solid fats, added sugars, and salt. Eat the right amount of calories for you.Get information about a proper diet from your health care provider, if necessary.  Regular physical exercise is one of the most important things you can do for your health. Most adults should get at least 150 minutes of moderate-intensity exercise (any activity that increases your heart rate and causes you to sweat) each week. In addition, most adults need muscle-strengthening exercises on 2 or more days a week.  Maintain a healthy weight. The body mass index (BMI) is a screening tool to identify possible weight problems. It provides an estimate of body fat based on height and weight. Your health care provider can find your BMI, and can help you achieve or maintain a healthy weight.For adults 20 years and older:  A BMI below 18.5 is considered underweight.  A BMI of 18.5 to 24.9 is normal.  A BMI of 25 to 29.9 is considered overweight.  A BMI of  30 and above is considered obese.  Maintain normal blood lipids and cholesterol levels by exercising and minimizing your intake of saturated fat. Eat a balanced diet with plenty of fruit and vegetables. Blood tests for lipids and cholesterol should begin at age 89 and be repeated every 5 years. If your lipid or cholesterol levels are high, you are over 50, or you are at high risk for heart disease, you may need your cholesterol  levels checked more frequently.Ongoing high lipid and cholesterol levels should be treated with medicines if diet and exercise are not working.  If you smoke, find out from your health care provider how to quit. If you do not use tobacco, do not start.  Lung cancer screening is recommended for adults aged 44 80 years who are at high risk for developing lung cancer because of a history of smoking. A yearly low-dose CT scan of the lungs is recommended for people who have at least a 30-pack-year history of smoking and are a current smoker or have quit within the past 15 years. A pack year of smoking is smoking an average of 1 pack of cigarettes a day for 1 year (for example: 1 pack a day for 30 years or 2 packs a day for 15 years). Yearly screening should continue until the smoker has stopped smoking for at least 15 years. Yearly screening should be stopped for people who develop a health problem that would prevent them from having lung cancer treatment.  If you are pregnant, do not drink alcohol. If you are breastfeeding, be very cautious about drinking alcohol. If you are not pregnant and choose to drink alcohol, do not have more than 1 drink per day. One drink is considered to be 12 ounces (355 mL) of beer, 5 ounces (148 mL) of wine, or 1.5 ounces (44 mL) of liquor.  Avoid use of street drugs. Do not share needles with anyone. Ask for help if you need support or instructions about stopping the use of drugs.  High blood pressure causes heart disease and increases the risk of stroke. Your blood pressure should be checked at least every 1 to 2 years. Ongoing high blood pressure should be treated with medicines if weight loss and exercise do not work.  If you are 1 41 years old, ask your health care provider if you should take aspirin to prevent strokes.  Diabetes screening involves taking a blood sample to check your fasting blood sugar level. This should be done once every 3 years, after age 49, if you  are within normal weight and without risk factors for diabetes. Testing should be considered at a younger age or be carried out more frequently if you are overweight and have at least 1 risk factor for diabetes.  Breast cancer screening is essential preventive care for women. You should practice "breast self-awareness." This means understanding the normal appearance and feel of your breasts and may include breast self-examination. Any changes detected, no matter how small, should be reported to a health care provider. Women in their 91s and 30s should have a clinical breast exam (CBE) by a health care provider as part of a regular health exam every 1 to 3 years. After age 34, women should have a CBE every year. Starting at age 35, women should consider having a mammogram (breast X-ray test) every year. Women who have a family history of breast cancer should talk to their health care provider about genetic screening. Women at a high risk of  breast cancer should talk to their health care providers about having an MRI and a mammogram every year.  Breast cancer gene (BRCA)-related cancer risk assessment is recommended for women who have family members with BRCA-related cancers. BRCA-related cancers include breast, ovarian, tubal, and peritoneal cancers. Having family members with these cancers may be associated with an increased risk for harmful changes (mutations) in the breast cancer genes BRCA1 and BRCA2. Results of the assessment will determine the need for genetic counseling and BRCA1 and BRCA2 testing.  The Pap test is a screening test for cervical cancer. A Pap test can show cell changes on the cervix that might become cervical cancer if left untreated. A Pap test is a procedure in which cells are obtained and examined from the lower end of the uterus (cervix).  Women should have a Pap test starting at age 40.  Between ages 47 and 91, Pap tests should be repeated every 2 years.  Beginning at age 5,  you should have a Pap test every 3 years as long as the past 3 Pap tests have been normal.  Some women have medical problems that increase the chance of getting cervical cancer. Talk to your health care provider about these problems. It is especially important to talk to your health care provider if a new problem develops soon after your last Pap test. In these cases, your health care provider may recommend more frequent screening and Pap tests.  The above recommendations are the same for women who have or have not gotten the vaccine for human papillomavirus (HPV).  If you had a hysterectomy for a problem that was not cancer or a condition that could lead to cancer, then you no longer need Pap tests. Even if you no longer need a Pap test, a regular exam is a good idea to make sure no other problems are starting.  If you are between ages 10 and 87 years, and you have had normal Pap tests going back 10 years, you no longer need Pap tests. Even if you no longer need a Pap test, a regular exam is a good idea to make sure no other problems are starting.  If you have had past treatment for cervical cancer or a condition that could lead to cancer, you need Pap tests and screening for cancer for at least 20 years after your treatment.  If Pap tests have been discontinued, risk factors (such as a new sexual partner) need to be reassessed to determine if screening should be resumed.  The HPV test is an additional test that may be used for cervical cancer screening. The HPV test looks for the virus that can cause the cell changes on the cervix. The cells collected during the Pap test can be tested for HPV. The HPV test could be used to screen women aged 55 years and older, and should be used in women of any age who have unclear Pap test results. After the age of 56, women should have HPV testing at the same frequency as a Pap test.  Colorectal cancer can be detected and often prevented. Most routine colorectal  cancer screening begins at the age of 58 years and continues through age 66 years. However, your health care provider may recommend screening at an earlier age if you have risk factors for colon cancer. On a yearly basis, your health care provider may provide home test kits to check for hidden blood in the stool. Use of a small camera at the end  of a tube, to directly examine the colon (sigmoidoscopy or colonoscopy), can detect the earliest forms of colorectal cancer. Talk to your health care provider about this at age 42, when routine screening begins. Direct exam of the colon should be repeated every 5 10 years through age 77 years, unless early forms of pre-cancerous polyps or small growths are found.  People who are at an increased risk for hepatitis B should be screened for this virus. You are considered at high risk for hepatitis B if:  You were born in a country where hepatitis B occurs often. Talk with your health care provider about which countries are considered high risk.  Your parents were born in a high-risk country and you have not received a shot to protect against hepatitis B (hepatitis B vaccine).  You have HIV or AIDS.  You use needles to inject street drugs.  You live with, or have sex with, someone who has Hepatitis B.  You get hemodialysis treatment.  You take certain medicines for conditions like cancer, organ transplantation, and autoimmune conditions.  Hepatitis C blood testing is recommended for all people born from [birth year] through 1963 and any individual with known risks for hepatitis C.  Practice safe sex. Use condoms and avoid high-risk sexual practices to reduce the spread of sexually transmitted infections (STIs). STIs include gonorrhea, chlamydia, syphilis, trichomonas, herpes, HPV, and human immunodeficiency virus (HIV). Herpes, HIV, and HPV are viral illnesses that have no cure. They can result in disability, cancer, and death. Sexually active women aged 85 years  and younger should be checked for chlamydia. Older women with new or multiple partners should also be tested for chlamydia. Testing for other STIs is recommended if you are sexually active and at increased risk.  Osteoporosis is a disease in which the bones lose minerals and strength with aging. This can result in serious bone fractures or breaks. The risk of osteoporosis can be identified using a bone density scan. Women ages 84 years and over and women at risk for fractures or osteoporosis should discuss screening with their health care providers. Ask your health care provider whether you should take a calcium supplement or vitamin D to reduce the rate of osteoporosis.  Menopause can be associated with physical symptoms and risks. Hormone replacement therapy is available to decrease symptoms and risks. You should talk to your health care provider about whether hormone replacement therapy is right for you.  Use sunscreen. Apply sunscreen liberally and repeatedly throughout the day. You should seek shade when your shadow is shorter than you. Protect yourself by wearing long sleeves, pants, a wide-brimmed hat, and sunglasses year round, whenever you are outdoors.  Once a month, do a whole body skin exam, using a mirror to look at the skin on your back. Tell your health care provider of new moles, moles that have irregular borders, moles that are larger than a pencil eraser, or moles that have changed in shape or color.  Stay current with required vaccines (immunizations).  Influenza vaccine. All adults should be immunized every year.  Tetanus, diphtheria, and acellular pertussis (Td, Tdap) vaccine. Pregnant women should receive 1 dose of Tdap vaccine during each pregnancy. The dose should be obtained regardless of the length of time since the last dose. Immunization is preferred during the 27th 36th week of gestation. An adult who has not previously received Tdap or who does not know her vaccine status  should receive 1 dose of Tdap. This initial dose should be followed  by tetanus and diphtheria toxoids (Td) booster doses every 10 years. Adults with an unknown or incomplete history of completing a 3-dose immunization series with Td-containing vaccines should begin or complete a primary immunization series including a Tdap dose. Adults should receive a Td booster every 10 years.  Varicella vaccine. An adult without evidence of immunity to varicella should receive 2 doses or a second dose if she has previously received 1 dose. Pregnant females who do not have evidence of immunity should receive the first dose after pregnancy. This first dose should be obtained before leaving the health care facility. The second dose should be obtained 4 8 weeks after the first dose.  Human papillomavirus (HPV) vaccine. Females aged 68 26 years who have not received the vaccine previously should obtain the 3-dose series. The vaccine is not recommended for use in pregnant females. However, pregnancy testing is not needed before receiving a dose. If a female is found to be pregnant after receiving a dose, no treatment is needed. In that case, the remaining doses should be delayed until after the pregnancy. Immunization is recommended for any person with an immunocompromised condition through the age of 30 years if she did not get any or all doses earlier. During the 3-dose series, the second dose should be obtained 4 8 weeks after the first dose. The third dose should be obtained 24 weeks after the first dose and 16 weeks after the second dose.  Zoster vaccine. One dose is recommended for adults aged 40 years or older unless certain conditions are present.  Measles, mumps, and rubella (MMR) vaccine. Adults born before [birth year] generally are considered immune to measles and mumps. Adults born in [birth year] or later should have 1 or more doses of MMR vaccine unless there is a contraindication to the vaccine or there is laboratory evidence  of immunity to each of the three diseases. A routine second dose of MMR vaccine should be obtained at least 28 days after the first dose for students attending postsecondary schools, health care workers, or international travelers. People who received inactivated measles vaccine or an unknown type of measles vaccine during 1963 1967 should receive 2 doses of MMR vaccine. People who received inactivated mumps vaccine or an unknown type of mumps vaccine before 1979 and are at high risk for mumps infection should consider immunization with 2 doses of MMR vaccine. For females of childbearing age, rubella immunity should be determined. If there is no evidence of immunity, females who are not pregnant should be vaccinated. If there is no evidence of immunity, females who are pregnant should delay immunization until after pregnancy. Unvaccinated health care workers born before [birth year] who lack laboratory evidence of measles, mumps, or rubella immunity or laboratory confirmation of disease should consider measles and mumps immunization with 2 doses of MMR vaccine or rubella immunization with 1 dose of MMR vaccine.  Pneumococcal 13-valent conjugate (PCV13) vaccine. When indicated, a person who is uncertain of her immunization history and has no record of immunization should receive the PCV13 vaccine. An adult aged 104 years or older who has certain medical conditions and has not been previously immunized should receive 1 dose of PCV13 vaccine. This PCV13 should be followed with a dose of pneumococcal polysaccharide (PPSV23) vaccine. The PPSV23 vaccine dose should be obtained at least 8 weeks after the dose of PCV13 vaccine. An adult aged 67 years or older who has certain medical conditions and previously received 1 or more doses of PPSV23 vaccine should receive 1  dose of PCV13. The PCV13 vaccine dose should be obtained 1 or more years after the last PPSV23 vaccine dose.  Pneumococcal polysaccharide (PPSV23) vaccine. When  PCV13 is also indicated, PCV13 should be obtained first. All adults aged 24 years and older should be immunized. An adult younger than age 55 years who has certain medical conditions should be immunized. Any person who resides in a nursing home or long-term care facility should be immunized. An adult smoker should be immunized. People with an immunocompromised condition and certain other conditions should receive both PCV13 and PPSV23 vaccines. People with human immunodeficiency virus (HIV) infection should be immunized as soon as possible after diagnosis. Immunization during chemotherapy or radiation therapy should be avoided. Routine use of PPSV23 vaccine is not recommended for American Indians, [place] Natives, or people younger than 65 years unless there are medical conditions that require PPSV23 vaccine. When indicated, people who have unknown immunization and have no record of immunization should receive PPSV23 vaccine. One-time revaccination 5 years after the first dose of PPSV23 is recommended for people aged 25 64 years who have chronic kidney failure, nephrotic syndrome, asplenia, or immunocompromised conditions. People who received 1 2 doses of PPSV23 before age 19 years should receive another dose of PPSV23 vaccine at age 16 years or later if at least 5 years have passed since the previous dose. Doses of PPSV23 are not needed for people immunized with PPSV23 at or after age 88 years.  Meningococcal vaccine. Adults with asplenia or persistent complement component deficiencies should receive 2 doses of quadrivalent meningococcal conjugate (MenACWY-D) vaccine. The doses should be obtained at least 2 months apart. Microbiologists working with certain meningococcal bacteria, [employer] recruits, people at risk during an outbreak, and people who travel to or live in countries with a high rate of meningitis should be immunized. A first-year college student up through age 82 years who is living in a residence  hall should receive a dose if she did not receive a dose on or after her 16th birthday. Adults who have certain high-risk conditions should receive one or more doses of vaccine.  Hepatitis A vaccine. Adults who wish to be protected from this disease, have certain high-risk conditions, work with hepatitis A-infected animals, work in hepatitis A research labs, or travel to or work in countries with a high rate of hepatitis A should be immunized. Adults who were previously unvaccinated and who anticipate close contact with an international adoptee during the first 60 days after arrival in the [place] from a country with a high rate of hepatitis A should be immunized.  Hepatitis B vaccine. Adults who wish to be protected from this disease, have certain high-risk conditions, may be exposed to blood or other infectious body fluids, are household contacts or sex partners of hepatitis B positive people, are clients or workers in certain care facilities, or travel to or work in countries with a high rate of hepatitis B should be immunized.  Haemophilus influenzae type b (Hib) vaccine. A previously unvaccinated person with asplenia or sickle cell disease or having a scheduled splenectomy should receive 1 dose of Hib vaccine. Regardless of previous immunization, a recipient of a hematopoietic stem cell transplant should receive a 3-dose series 6 12 months after her successful transplant. Hib vaccine is not recommended for adults with HIV infection. Preventive Services / Frequency Ages 47 to 39years  Blood pressure check.** / Every 1 to 2 years.  Lipid and cholesterol check.** / Every 5 years beginning at age  20.  Clinical breast exam.** / Every 3 years for women in their 96s and 10s.  BRCA-related cancer risk assessment.** / For women who have family members with a BRCA-related cancer (breast, ovarian, tubal, or peritoneal cancers).  Pap test.** / Every 2 years from ages 71 through 61. Every 3 years  starting at age 57 through age 68 or 54 with a history of 3 consecutive normal Pap tests.  HPV screening.** / Every 3 years from ages 34 through ages 42 to 9 with a history of 3 consecutive normal Pap tests.  Hepatitis C blood test.** / For any individual with known risks for hepatitis C.  Skin self-exam. / Monthly.  Influenza vaccine. / Every year.  Tetanus, diphtheria, and acellular pertussis (Tdap, Td) vaccine.** / Consult your health care provider. Pregnant women should receive 1 dose of Tdap vaccine during each pregnancy. 1 dose of Td every 10 years.  Varicella vaccine.** / Consult your health care provider. Pregnant females who do not have evidence of immunity should receive the first dose after pregnancy.  HPV vaccine. / 3 doses over 6 months, if 48 and younger. The vaccine is not recommended for use in pregnant females. However, pregnancy testing is not needed before receiving a dose.  Measles, mumps, rubella (MMR) vaccine.** / You need at least 1 dose of MMR if you were born in 1957 or later. You may also need a 2nd dose. For females of childbearing age, rubella immunity should be determined. If there is no evidence of immunity, females who are not pregnant should be vaccinated. If there is no evidence of immunity, females who are pregnant should delay immunization until after pregnancy.  Pneumococcal 13-valent conjugate (PCV13) vaccine.** / Consult your health care provider.  Pneumococcal polysaccharide (PPSV23) vaccine.** / 1 to 2 doses if you smoke cigarettes or if you have certain conditions.  Meningococcal vaccine.** / 1 dose if you are age 33 to 18 years and a [employer] researcher living in a residence hall, or have one of several medical conditions, you need to get vaccinated against meningococcal disease. You may also need additional booster doses.  Hepatitis A vaccine.** / Consult your health care provider.  Hepatitis B vaccine.** / Consult your health care  provider.  Haemophilus influenzae type b (Hib) vaccine.** / Consult your health care provider. Ages 65 to 64years  Blood pressure check.** / Every 1 to 2 years.  Lipid and cholesterol check.** / Every 5 years beginning at age 17 years.  Lung cancer screening. / Every year if you are aged 6 80 years and have a 30-pack-year history of smoking and currently smoke or have quit within the past 15 years. Yearly screening is stopped once you have quit smoking for at least 15 years or develop a health problem that would prevent you from having lung cancer treatment.  Clinical breast exam.** / Every year after age 1 years.  BRCA-related cancer risk assessment.** / For women who have family members with a BRCA-related cancer (breast, ovarian, tubal, or peritoneal cancers).  Mammogram.** / Every year beginning at age 52 years and continuing for as long as you are in good health. Consult with your health care provider.  Pap test.** / Every 3 years starting at age 78 years through age 48 or 53 years with a history of 3 consecutive normal Pap tests.  HPV screening.** / Every 3 years from ages 49 years through ages 13 to 60 years with a history of 3 consecutive normal Pap tests.  Fecal occult blood test (FOBT) of stool. / Every year beginning at age 36 years and continuing until age 49 years. You may not need to do this test if you get a colonoscopy every 10 years.  Flexible sigmoidoscopy or colonoscopy.** / Every 5 years for a flexible sigmoidoscopy or every 10 years for a colonoscopy beginning at age 22 years and continuing until age 39 years.  Hepatitis C blood test.** / For all people born from [birth year] through 1963 and any individual with known risks for hepatitis C.  Skin self-exam. / Monthly.  Influenza vaccine. / Every year.  Tetanus, diphtheria, and acellular pertussis (Tdap/Td) vaccine.** / Consult your health care provider. Pregnant women should receive 1 dose of Tdap vaccine during each  pregnancy. 1 dose of Td every 10 years.  Varicella vaccine.** / Consult your health care provider. Pregnant females who do not have evidence of immunity should receive the first dose after pregnancy.  Zoster vaccine.** / 1 dose for adults aged 29 years or older.  Measles, mumps, rubella (MMR) vaccine.** / You need at least 1 dose of MMR if you were born in 1957 or later. You may also need a 2nd dose. For females of childbearing age, rubella immunity should be determined. If there is no evidence of immunity, females who are not pregnant should be vaccinated. If there is no evidence of immunity, females who are pregnant should delay immunization until after pregnancy.  Pneumococcal 13-valent conjugate (PCV13) vaccine.** / Consult your health care provider.  Pneumococcal polysaccharide (PPSV23) vaccine.** / 1 to 2 doses if you smoke cigarettes or if you have certain conditions.  Meningococcal vaccine.** / Consult your health care provider.  Hepatitis A vaccine.** / Consult your health care provider.  Hepatitis B vaccine.** / Consult your health care provider.  Haemophilus influenzae type b (Hib) vaccine.** / Consult your health care provider. Ages 58 years and over  Blood pressure check.** / Every 1 to 2 years.  Lipid and cholesterol check.** / Every 5 years beginning at age 95 years.  Lung cancer screening. / Every year if you are aged 104 80 years and have a 30-pack-year history of smoking and currently smoke or have quit within the past 15 years. Yearly screening is stopped once you have quit smoking for at least 15 years or develop a health problem that would prevent you from having lung cancer treatment.  Clinical breast exam.** / Every year after age 40 years.  BRCA-related cancer risk assessment.** / For women who have family members with a BRCA-related cancer (breast, ovarian, tubal, or peritoneal cancers).  Mammogram.** / Every year beginning at age 3 years and continuing for as  long as you are in good health. Consult with your health care provider.  Pap test.** / Every 3 years starting at age 38 years through age 54 or 49 years with 3 consecutive normal Pap tests. Testing can be stopped between 65 and 70 years with 3 consecutive normal Pap tests and no abnormal Pap or HPV tests in the past 10 years.  HPV screening.** / Every 3 years from ages 60 years through ages 46 or 71 years with a history of 3 consecutive normal Pap tests. Testing can be stopped between 65 and 70 years with 3 consecutive normal Pap tests and no abnormal Pap or HPV tests in the past 10 years.  Fecal occult blood test (FOBT) of stool. / Every year beginning at age 59 years and continuing until age 21 years. You may not need  to do this test if you get a colonoscopy every 10 years.  Flexible sigmoidoscopy or colonoscopy.** / Every 5 years for a flexible sigmoidoscopy or every 10 years for a colonoscopy beginning at age 40 years and continuing until age 77 years.  Hepatitis C blood test.** / For all people born from [birth year] through 1965 and any individual with known risks for hepatitis C.  Osteoporosis screening.** / A one-time screening for women ages 64 years and over and women at risk for fractures or osteoporosis.  Skin self-exam. / Monthly.  Influenza vaccine. / Every year.  Tetanus, diphtheria, and acellular pertussis (Tdap/Td) vaccine.** / 1 dose of Td every 10 years.  Varicella vaccine.** / Consult your health care provider.  Zoster vaccine.** / 1 dose for adults aged 71 years or older.  Pneumococcal 13-valent conjugate (PCV13) vaccine.** / Consult your health care provider.  Pneumococcal polysaccharide (PPSV23) vaccine.** / 1 dose for all adults aged 24 years and older.  Meningococcal vaccine.** / Consult your health care provider.  Hepatitis A vaccine.** / Consult your health care provider.  Hepatitis B vaccine.** / Consult your health care provider.  Haemophilus influenzae type b  (Hib) vaccine.** / Consult your health care provider. ** Family history and personal history of risk and conditions may change your health care provider's recommendations. Document Released: 02/17/2001 Document Revised: 10/12/2012 Document Reviewed: 05/19/2010 [facility] Patient Information 2014 [place], [place].

## 2013-05-09 NOTE — Progress Notes (Signed)
    GYNECOLOGY CLINIC ANNUAL PREVENTATIVE CARE ENCOUNTER NOTE  Subjective:     Gloria Flynn is a 43 y.o. G22P1102 female here for a routine annual gynecologic exam.  Current complaints:occasional left sided pelvic pain that occurs at night. Pain is around her joint and lower abdominal pain.  Throbbing pain, alleviated with Aleve.  Not associated with any bleeding.  Pain can occur at other times during the day.  Normal periods.  No problems with sexual activity   Gynecologic History Patient's last menstrual period was 04/14/2013. Contraception: none Last Pap: 2009. Results were: abnormal with HPV as per patient, was done in [place] Last mammogram: never  Obstetric History OB History  Gravida Para Term Preterm AB SAB TAB Ectopic Multiple Living  3 2 1 1  0 0 0 0 0 2    # Outcome Date GA Lbr Len/2nd Weight Sex Delivery Anes PTL Lv  3 PRE 03/17/08    M LTCS  N Y     Comments: low fluid, high blood pressure  2 TRM 02/21/95    F SVD  N   1 GRA              Comments: System Generated. Please review and update pregnancy details.     The following portions of the patient's history were reviewed and updated as appropriate: allergies, current medications, past family history, past medical history, past social history, past surgical history and problem list.  Review of Systems Pertinent items are noted in HPI.    Objective:   BP 125/84  Pulse 74  Ht 5' 5"  (1.651 m)  Wt 242 lb (109.77 kg)  BMI 40.27 kg/m2  LMP 04/14/2013 GENERAL: Well-developed, well-nourished female in no acute distress.  HEENT: Normocephalic, atraumatic. Sclerae anicteric.  NECK:  Significantly enlarged goiter noted; being evaluated at [facility].  LUNGS: Clear to auscultation bilaterally.  HEART: Regular rate and rhythm. BREASTS: Symmetric in size. No masses, skin changes, nipple drainage, or lymphadenopathy. ABDOMEN: Soft, nontender, nondistended. No organomegaly. PELVIC: Normal external female genitalia.  Vagina is pink and rugated.  Normal discharge. Normal cervix contour. Pap smear obtained; small amount of bleeding after pap. Uterus is normal in size. No adnexal mass or tenderness.  EXTREMITIES: No cyanosis, clubbing, or edema, 2+ distal pulses.  Assessment:   Annual gynecologic examination   Plan:   Pap and labs done, will follow up results and manage accordingly. Mammogram scheduled Anaprox DS prescribed Routine preventative health maintenance measures emphasized   Verita Schneiders, MD, [place] Attending [place], [place]

## 2013-05-10 LAB — COMPREHENSIVE METABOLIC PANEL
ALK PHOS: 52 U/L (ref 39–117)
ALT: 10 U/L (ref 0–35)
AST: 14 U/L (ref 0–37)
Albumin: 4.2 g/dL (ref 3.5–5.2)
BILIRUBIN TOTAL: 0.6 mg/dL (ref 0.2–1.2)
BUN: 9 mg/dL (ref 6–23)
CO2: 20 meq/L (ref 19–32)
CREATININE: 0.8 mg/dL (ref 0.50–1.10)
Calcium: 9 mg/dL (ref 8.4–10.5)
Chloride: 105 mEq/L (ref 96–112)
GLUCOSE: 80 mg/dL (ref 70–99)
Potassium: 4.1 mEq/L (ref 3.5–5.3)
Sodium: 138 mEq/L (ref 135–145)
Total Protein: 7.1 g/dL (ref 6.0–8.3)

## 2013-05-10 LAB — LIPID PANEL
CHOL/HDL RATIO: 3 ratio
CHOLESTEROL: 158 mg/dL (ref 0–200)
HDL: 52 mg/dL (ref >39)
LDL Cholesterol: 92 mg/dL (ref 0–99)
TRIGLYCERIDES: 71 mg/dL (ref <150)
VLDL: 14 mg/dL (ref 0–40)

## 2013-05-10 LAB — RPR

## 2013-05-10 LAB — HEMOGLOBIN A1C
Hgb A1c MFr Bld: 5.5 % (ref <5.7)
Mean Plasma Glucose: 111 mg/dL (ref <117)

## 2013-05-10 LAB — HEPATITIS C ANTIBODY: HCV Ab: NEGATIVE

## 2013-05-10 LAB — HIV ANTIBODY (ROUTINE TESTING W REFLEX): HIV: NONREACTIVE

## 2013-05-25 ENCOUNTER — Telehealth: Payer: Self-pay | Admitting: *Deleted

## 2013-05-25 NOTE — Telephone Encounter (Signed)
Patient given lab results.

## 2013-06-01 ENCOUNTER — Ambulatory Visit (HOSPITAL_COMMUNITY)
Admission: RE | Admit: 2013-06-01 | Discharge: 2013-06-01 | Disposition: A | Discharge status: Home / Self Care | Payer: BC Managed Care – PPO | Source: Ambulatory Visit | Attending: Obstetrics & Gynecology | Admitting: Obstetrics & Gynecology

## 2013-06-01 ENCOUNTER — Other Ambulatory Visit: Payer: Self-pay | Admitting: Obstetrics & Gynecology

## 2013-06-01 DIAGNOSIS — Z01419 Encounter for gynecological examination (general) (routine) without abnormal findings: Secondary | ICD-10-CM

## 2013-06-01 DIAGNOSIS — Z1231 Encounter for screening mammogram for malignant neoplasm of breast: Secondary | ICD-10-CM | POA: Insufficient documentation

## 2013-06-20 ENCOUNTER — Ambulatory Visit: Payer: BC Managed Care – PPO | Admitting: Family Medicine

## 2013-07-31 ENCOUNTER — Encounter: Payer: Self-pay | Admitting: Internal Medicine

## 2013-07-31 ENCOUNTER — Ambulatory Visit (INDEPENDENT_AMBULATORY_CARE_PROVIDER_SITE_OTHER): Payer: Commercial Managed Care - PPO | Admitting: Internal Medicine

## 2013-07-31 VITALS — BP 114/62 | HR 81 | Temp 98.2°F | Wt 254.0 lb

## 2013-07-31 DIAGNOSIS — J3089 Other allergic rhinitis: Secondary | ICD-10-CM

## 2013-07-31 DIAGNOSIS — R0989 Other specified symptoms and signs involving the circulatory and respiratory systems: Secondary | ICD-10-CM

## 2013-07-31 DIAGNOSIS — J302 Other seasonal allergic rhinitis: Secondary | ICD-10-CM

## 2013-07-31 DIAGNOSIS — J989 Respiratory disorder, unspecified: Secondary | ICD-10-CM

## 2013-07-31 MED ORDER — ALBUTEROL SULFATE HFA 108 (90 BASE) MCG/ACT IN AERS: 2.0000 | INHALATION_SPRAY | Freq: Four times a day (QID) | RESPIRATORY_TRACT | Status: DC | PRN

## 2013-07-31 NOTE — Progress Notes (Signed)
Pre visit review using our clinic review tool, if applicable. No additional management support is needed unless otherwise documented below in the visit note. 

## 2013-07-31 NOTE — Progress Notes (Signed)
HPI  Pt presents to the clinic today with c/o cough, sore throat and dizziness. She reports this started 2 days ago. The cough is not productive. She has had some associated runny nose and nasal congestion.  She denies fever, chills or body aches. She has not tried anything OTC. She has no history of allergies or asthma. She has not had sick contacts that she is aware of.   Review of Systems      Past Medical History  Diagnosis Date  . Pregnancy induced hypertension   . Urinary tract infection   . Keloid   . Goiter     Dr. Shawna Orleans in [place]  . HPV in female 2009    Family History  Problem Relation Age of Onset  . Anesthesia problems Neg Hx   . Diabetes Father   . Heart disease Father     History   Social History  . Marital Status: Married    Spouse Name: N/A    Number of Children: N/A  . Years of Education: N/A   Occupational History  . Not on file.   Social History Main Topics  . Smoking status: Never Smoker   . Smokeless tobacco: Never Used  . Alcohol Use: No  . Drug Use: No  . Sexual Activity: Yes    Partners: Male    Birth Control/ Protection: None   Other Topics Concern  . Not on file   Social History Narrative  . No narrative on file    Allergies  Allergen Reactions  . Tessalon [Benzonatate]     Dizzy, drowsy and h/a     Constitutional: Positive fatigue . Denies headache, fever or abrupt weight changes.  HEENT:  Positive runny nose, nasal congestion, sore throat. Denies eye redness, eye pain, pressure behind the eyes, facial pain, ear pain, ringing in the ears, wax buildup or bloody nose. Respiratory: Positive cough. Denies difficulty breathing or shortness of breath.  Cardiovascular: Denies chest pain, chest tightness, palpitations or swelling in the hands or feet.   No other specific complaints in a complete review of systems (except as listed in HPI above).  Objective:   BP 114/62  Pulse 81  Temp(Src) 98.2 F (36.8 C) (Oral)  Wt 254 lb  (115.214 kg)  SpO2 99% Wt Readings from Last 3 Encounters:  07/31/13 254 lb (115.214 kg)  05/09/13 242 lb (109.77 kg)  12/13/12 231 lb 8 oz (105.008 kg)     General: Appears her stated age, obese but well developed, well nourished in NAD. HEENT: Head: normal shape and size; Eyes: sclera white, no icterus, conjunctiva pink, PERRLA and EOMs intact; Ears: Tm's gray and intact, normal light reflex; Nose: mucosa pink and moist, septum midline; Throat/Mouth: + PND. Teeth present, mucosa erythematous and moist, no exudate noted, no lesions or ulcerations noted.  Cardiovascular: Normal rate and rhythm. S1,S2 noted.  No murmur, rubs or gallops noted. No JVD or BLE edema. No carotid bruits noted. Pulmonary/Chest: Normal effort and positive vesicular breath sounds. No respiratory distress. No wheezes, rales or ronchi noted.      Assessment & Plan:   Allergic Rhintis:  Get some rest and drink plenty of water Do salt water gargles for the sore throat Try zyrtec and flonase OTC Albuterol inhaler refilled today  RTC as needed or if symptoms persist.

## 2013-07-31 NOTE — Patient Instructions (Addendum)
Allergic Rhinitis Allergic rhinitis is when the mucous membranes in the nose respond to allergens. Allergens are particles in the air that cause your body to have an allergic reaction. This causes you to release allergic antibodies. Through a chain of events, these eventually cause you to release histamine into the blood stream. Although meant to protect the body, it is this release of histamine that causes your discomfort, such as frequent sneezing, congestion, and an itchy, runny nose.  CAUSES  Seasonal allergic rhinitis (hay fever) is caused by pollen allergens that may come from grasses, trees, and weeds. Year-round allergic rhinitis (perennial allergic rhinitis) is caused by allergens such as house dust mites, pet dander, and mold spores.  SYMPTOMS   Nasal stuffiness (congestion).  Itchy, runny nose with sneezing and tearing of the eyes. DIAGNOSIS  Your health care provider can help you determine the allergen or allergens that trigger your symptoms. If you and your health care provider are unable to determine the allergen, skin or blood testing may be used. TREATMENT  Allergic rhinitis does not have a cure, but it can be controlled by:  Medicines and allergy shots (immunotherapy).  Avoiding the allergen. Hay fever may often be treated with antihistamines in pill or nasal spray forms. Antihistamines block the effects of histamine. There are over-the-counter medicines that may help with nasal congestion and swelling around the eyes. Check with your health care provider before taking or giving this medicine.  If avoiding the allergen or the medicine prescribed do not work, there are many new medicines your health care provider can prescribe. Stronger medicine may be used if initial measures are ineffective. Desensitizing injections can be used if medicine and avoidance does not work. Desensitization is when a patient is given ongoing shots until the body becomes less sensitive to the allergen.  Make sure you follow up with your health care provider if problems continue. HOME CARE INSTRUCTIONS It is not possible to completely avoid allergens, but you can reduce your symptoms by taking steps to limit your exposure to them. It helps to know exactly what you are allergic to so that you can avoid your specific triggers. SEEK MEDICAL CARE IF:   You have a fever.  You develop a cough that does not stop easily (persistent).  You have shortness of breath.  You start wheezing.  Symptoms interfere with normal daily activities. Document Released: 09/16/2000 Document Revised: 12/27/2012 Document Reviewed: 08/29/2012 [facility] Patient Information 2015 [place], [place]. This information is not intended to replace advice given to you by your health care provider. Make sure you discuss any questions you have with your health care provider.

## 2013-08-11 ENCOUNTER — Encounter: Payer: Self-pay | Admitting: *Deleted

## 2013-08-11 ENCOUNTER — Ambulatory Visit (INDEPENDENT_AMBULATORY_CARE_PROVIDER_SITE_OTHER): Payer: Commercial Managed Care - PPO | Admitting: Internal Medicine

## 2013-08-11 ENCOUNTER — Encounter: Payer: Self-pay | Admitting: Internal Medicine

## 2013-08-11 VITALS — BP 126/82 | HR 78 | Temp 98.2°F | Wt 255.5 lb

## 2013-08-11 DIAGNOSIS — R51 Headache: Secondary | ICD-10-CM

## 2013-08-11 MED ORDER — KETOROLAC TROMETHAMINE 30 MG/ML IJ SOLN
30.0000 mg | Freq: Once | INTRAMUSCULAR | Status: AC
  Administered 2013-08-11: 30 mg via INTRAMUSCULAR

## 2013-08-11 NOTE — Progress Notes (Signed)
Pre visit review using our clinic review tool, if applicable. No additional management support is needed unless otherwise documented below in the visit note. 

## 2013-08-11 NOTE — Progress Notes (Signed)
Subjective:    Patient ID: Gloria Flynn, female    DOB: 15-Sep-1970, 43 y.o.   MRN: [id number]  HPI  Pt presents to the clinic today with c/o headache. This started 3 days ago. She reports that it is in the frontal area and in the temples. She describes it as a pounding sensation. She denies sensitivity to light or sound. She denies nausea and vomiting. She has had headaches like this in the past, none that have lasted this long. She does not think this is the worst headache of her life. She has tried advil without any relief.  Review of Systems      Past Medical History  Diagnosis Date  . Pregnancy induced hypertension   . Urinary tract infection   . Keloid   . Goiter     Dr. Shawna Orleans in [place]  . HPV in female 2009    Current Outpatient Prescriptions  Medication Sig Dispense Refill  . albuterol (PROVENTIL HFA;VENTOLIN HFA) 108 (90 BASE) MCG/ACT inhaler Inhale 2 puffs into the lungs every 6 (six) hours as needed for wheezing.  1 Inhaler  2   No current facility-administered medications for this visit.    Allergies  Allergen Reactions  . Tessalon [Benzonatate]     Dizzy, drowsy and h/a    Family History  Problem Relation Age of Onset  . Anesthesia problems Neg Hx   . Diabetes Father   . Heart disease Father     History   Social History  . Marital Status: Married    Spouse Name: N/A    Number of Children: N/A  . Years of Education: N/A   Occupational History  . Not on file.   Social History Main Topics  . Smoking status: Never Smoker   . Smokeless tobacco: Never Used  . Alcohol Use: No  . Drug Use: No  . Sexual Activity: Yes    Partners: Male    Birth Control/ Protection: None   Other Topics Concern  . Not on file   Social History Narrative  . No narrative on file     Constitutional: Pt report headache. Denies fever, malaise, fatigue, or abrupt weight changes.  Neurological: Denies dizziness, difficulty with memory, difficulty with speech or  problems with balance and coordination.   No other specific complaints in a complete review of systems (except as listed in HPI above).  Objective:   Physical Exam   BP 126/82  Pulse 78  Temp(Src) 98.2 F (36.8 C) (Oral)  Wt 255 lb 8 oz (115.894 kg)  LMP 08/08/2013 Wt Readings from Last 3 Encounters:  08/11/13 255 lb 8 oz (115.894 kg)  07/31/13 254 lb (115.214 kg)  05/09/13 242 lb (109.77 kg)    General: Appear her stated age, well developed, well nourished in NAD. Cardiovascular: Normal rate and rhythm. S1,S2 noted.  No murmur, rubs or gallops noted. No JVD or BLE edema. No carotid bruits noted. Pulmonary/Chest: Normal effort and positive vesicular breath sounds. No respiratory distress. No wheezes, rales or ronchi noted.   Neurological: Alert and oriented. Cranial nerves II-XII intact. Coordination normal. +DTRs bilaterally.   BMET    Component Value Date/Time   NA 138 05/09/2013 1022   K 4.1 05/09/2013 1022   CL 105 05/09/2013 1022   CO2 20 05/09/2013 1022   GLUCOSE 80 05/09/2013 1022   BUN 9 05/09/2013 1022   CREATININE 0.80 05/09/2013 1022   CREATININE 0.8 06/25/2011 1402   CALCIUM 9.0 05/09/2013 1022  Lipid Panel     Component Value Date/Time   CHOL 158 05/09/2013 1022   TRIG 71 05/09/2013 1022   HDL 52 05/09/2013 1022   CHOLHDL 3.0 05/09/2013 1022   VLDL 14 05/09/2013 1022   LDLCALC 92 05/09/2013 1022    CBC    Component Value Date/Time   WBC 3.7* 05/09/2013 1022   RBC 4.73 05/09/2013 1022   HGB 12.8 05/09/2013 1022   HCT 39.2 05/09/2013 1022   PLT 265 05/09/2013 1022   MCV 82.9 05/09/2013 1022   MCH 27.1 05/09/2013 1022   MCHC 32.7 05/09/2013 1022   RDW 14.5 05/09/2013 1022   LYMPHSABS 2.0 01/29/2011 1652   MONOABS 0.5 01/29/2011 1652   EOSABS 0.0 01/29/2011 1652   BASOSABS 0.0 01/29/2011 1652    Hgb A1C Lab Results  Component Value Date   HGBA1C 5.5 05/09/2013        Assessment & Plan:   Headache:  ? Tension 30 mg Toradol IM today- she has someone to drive her home Continue  Ibuprofen every 8 hours if needed  If worse over the weekend, go to urgent care

## 2013-08-11 NOTE — Addendum Note (Signed)
Addended by: Royann Shivers A on: 08/11/2013 04:33 PM   Modules accepted: Orders

## 2013-08-11 NOTE — Patient Instructions (Addendum)

## 2013-09-22 HISTORY — PX: THYROID SURGERY: SHX805

## 2013-10-26 ENCOUNTER — Encounter: Payer: Self-pay | Admitting: Family Medicine

## 2013-10-26 ENCOUNTER — Ambulatory Visit (INDEPENDENT_AMBULATORY_CARE_PROVIDER_SITE_OTHER): Payer: Commercial Managed Care - PPO | Admitting: Family Medicine

## 2013-10-26 VITALS — BP 144/92 | HR 59 | Temp 98.1°F | Wt 266.0 lb

## 2013-10-26 DIAGNOSIS — Z9889 Other specified postprocedural states: Secondary | ICD-10-CM

## 2013-10-26 DIAGNOSIS — E890 Postprocedural hypothyroidism: Secondary | ICD-10-CM | POA: Insufficient documentation

## 2013-10-26 DIAGNOSIS — C73 Malignant neoplasm of thyroid gland: Secondary | ICD-10-CM

## 2013-10-26 LAB — CBC WITH DIFFERENTIAL/PLATELET
BASOS ABS: 0 10*3/uL (ref 0.0–0.1)
Basophils Relative: 0.8 % (ref 0.0–3.0)
EOS ABS: 0.1 10*3/uL (ref 0.0–0.7)
Eosinophils Relative: 1.2 % (ref 0.0–5.0)
HCT: 37.2 % (ref 36.0–46.0)
Hemoglobin: 11.8 g/dL — ABNORMAL LOW (ref 12.0–15.0)
LYMPHS ABS: 1.9 10*3/uL (ref 0.7–4.0)
LYMPHS PCT: 44.8 % (ref 12.0–46.0)
MCHC: 31.8 g/dL (ref 30.0–36.0)
MCV: 84.8 fl (ref 78.0–100.0)
MONO ABS: 0.3 10*3/uL (ref 0.1–1.0)
Monocytes Relative: 8 % (ref 3.0–12.0)
NEUTROS ABS: 1.9 10*3/uL (ref 1.4–7.7)
Neutrophils Relative %: 45.2 % (ref 43.0–77.0)
Platelets: 245 10*3/uL (ref 150.0–400.0)
RBC: 4.39 Mil/uL (ref 3.87–5.11)
RDW: 15.3 % (ref 11.5–15.5)
WBC: 4.3 10*3/uL (ref 4.0–10.5)

## 2013-10-26 LAB — TSH: TSH: 3.74 u[IU]/mL (ref 0.35–4.50)

## 2013-10-26 LAB — T3, FREE: T3, Free: 2.1 pg/mL — ABNORMAL LOW (ref 2.3–4.2)

## 2013-10-26 LAB — T4, FREE: FREE T4: 0.66 ng/dL (ref 0.60–1.60)

## 2013-10-26 MED ORDER — LEVOTHYROXINE SODIUM 100 MCG PO TABS: 100.0000 ug | ORAL_TABLET | Freq: Every day | ORAL | Status: DC

## 2013-10-26 NOTE — Assessment & Plan Note (Signed)
S/p thyroidectomy. Has been doing well. Per pt, no further tx needed. Will review notes from [facility] once I receive them.

## 2013-10-26 NOTE — Assessment & Plan Note (Signed)
Taking Synthroid 100 mcg daily. Will check thyroid labs today. The patient indicates understanding of these issues and agrees with the plan.  Orders Placed This Encounter  Procedures  . TSH  . T4, Free  . T3, Free  . CBC with Differential

## 2013-10-26 NOTE — Progress Notes (Signed)
Subjective:    Patient ID: Gloria Flynn, female    DOB: 03-27-1970, 43 y.o.   MRN: [id number]  HPI  43 yo pleasant female here for follow up.  S/p thyroidectomy on 09/22/13 by Dr. Tasia Catchings at [facility] was very large- 15 cm.   5 mm of papillary CA was found on pathology. She is now taking Synthroid 100 mcg daily. Denies any symptoms of hypo or hyperthyroidism. Feels "much better" now that thyroid has been removed. Was not having difficulty swallowing prior to surgery.  Per pt, surgeon told her to follow thyroid with me now.  Appetite has increased since surgery- has gained some weight. Wt Readings from Last 3 Encounters:  10/26/13 266 lb (120.657 kg)  08/11/13 255 lb 8 oz (115.894 kg)  07/31/13 254 lb (115.214 kg)   No post op complications.  Current Outpatient Prescriptions on File Prior to Visit  Medication Sig Dispense Refill  . albuterol (PROVENTIL HFA;VENTOLIN HFA) 108 (90 BASE) MCG/ACT inhaler Inhale 2 puffs into the lungs every 6 (six) hours as needed for wheezing.  1 Inhaler  2   No current facility-administered medications on file prior to visit.    Allergies  Allergen Reactions  . Tessalon [Benzonatate]     Dizzy, drowsy and h/a    Past Medical History  Diagnosis Date  . Pregnancy induced hypertension   . Urinary tract infection   . Keloid   . Goiter     Dr. Shawna Orleans in [place]  . HPV in female 2009    Past Surgical History  Procedure Laterality Date  . Cesarean section      Family History  Problem Relation Age of Onset  . Anesthesia problems Neg Hx   . Diabetes Father   . Heart disease Father     History   Social History  . Marital Status: Married    Spouse Name: N/A    Number of Children: N/A  . Years of Education: N/A   Occupational History  . Not on file.   Social History Main Topics  . Smoking status: Never Smoker   . Smokeless tobacco: Never Used  . Alcohol Use: No  . Drug Use: No  . Sexual Activity: Yes   Partners: Male    Birth Control/ Protection: None   Other Topics Concern  . Not on file   Social History Narrative  . No narrative on file   The PMH, PSH, Social History, Family History, Medications, and allergies have been reviewed in [facility], and have been updated if relevant.     Review of Systems  Constitutional: Positive for appetite change. Negative for fever and fatigue.  Eyes: Negative.   Respiratory: Negative.   Endocrine: Negative for cold intolerance and heat intolerance.  Neurological: Negative for tremors and weakness.  Psychiatric/Behavioral: Negative for agitation.  All other systems reviewed and are negative.      Objective:   Physical Exam  Nursing note and vitals reviewed. Constitutional: She is oriented to person, place, and time. She appears well-developed and well-nourished. No distress.  HENT:  Head: Normocephalic.  Neck:  Horizontal incision, well healed No swelling or TTP   Neurological: She is alert and oriented to person, place, and time. No cranial nerve deficit or sensory deficit.  No tremor  Skin: Skin is warm, dry and intact.  Psychiatric: She has a normal mood and affect. Her speech is normal and behavior is normal.   BP 144/92  Pulse 59  Temp(Src) 98.1 F (  36.7 C) (Oral)  Wt 266 lb (120.657 kg)  SpO2 99%  LMP 10/12/2013  Wt Readings from Last 3 Encounters:  10/26/13 266 lb (120.657 kg)  08/11/13 255 lb 8 oz (115.894 kg)  07/31/13 254 lb (115.214 kg)         Assessment & Plan:

## 2013-10-26 NOTE — Progress Notes (Signed)
Pre visit review using our clinic review tool, if applicable. No additional management support is needed unless otherwise documented below in the visit note. 

## 2013-10-26 NOTE — Patient Instructions (Signed)
Good to see you. I will call you with your lab results.

## 2013-10-27 ENCOUNTER — Encounter: Payer: Self-pay | Admitting: Family Medicine

## 2013-11-06 ENCOUNTER — Encounter: Payer: Self-pay | Admitting: Family Medicine

## 2013-11-20 ENCOUNTER — Telehealth: Payer: Self-pay

## 2013-11-20 NOTE — Telephone Encounter (Signed)
I cannot recommend OTC diet pills.

## 2013-11-20 NOTE — Telephone Encounter (Signed)
Spoke to pt and advised per Dr Deborra Medina. Pt verbally expressed understanding. Phone d/c; pt will probably call back

## 2013-11-20 NOTE — Telephone Encounter (Signed)
Pt left v/m; since pt got recent lab results pt wants to know if can take OTC slim quick diet pills. Pt request cb.

## 2013-11-26 ENCOUNTER — Other Ambulatory Visit: Payer: Self-pay | Admitting: Oncology

## 2013-12-11 ENCOUNTER — Encounter: Payer: Self-pay | Admitting: Internal Medicine

## 2013-12-11 ENCOUNTER — Encounter: Payer: Self-pay | Admitting: *Deleted

## 2013-12-11 ENCOUNTER — Ambulatory Visit (INDEPENDENT_AMBULATORY_CARE_PROVIDER_SITE_OTHER): Payer: BC Managed Care – PPO | Admitting: Internal Medicine

## 2013-12-11 VITALS — BP 148/90 | HR 63 | Temp 98.4°F | Wt 264.0 lb

## 2013-12-11 DIAGNOSIS — R519 Headache, unspecified: Secondary | ICD-10-CM | POA: Insufficient documentation

## 2013-12-11 DIAGNOSIS — R51 Headache: Secondary | ICD-10-CM

## 2013-12-11 NOTE — Progress Notes (Signed)
   Subjective:    Patient ID: Gloria Flynn, female    DOB: [birth year], 42 y.o.   MRN: [id number]  HPI Here for evaluation for headache This has been since yesterday some time and persisted Bifrontal-- worse when bending over. Feels like it is '"dizziness" Not really sharp or burning Was pounding bad this morning  This is not as bad as last time she was in  Had a sore throat last week but wasn't seen --thinks it may have been strep This is better now Cold symptoms before that  No aura No nausea or vomiting No sig photophobia or sonophobia  Took 1 motrin this Am--no help  Current Outpatient Prescriptions on File Prior to Visit  Medication Sig Dispense Refill  . levothyroxine (SYNTHROID, LEVOTHROID) 100 MCG tablet Take 1 tablet (100 mcg total) by mouth daily. 90 tablet 3   No current facility-administered medications on file prior to visit.    Allergies  Allergen Reactions  . Tessalon [Benzonatate]     Dizzy, drowsy and h/a    Past Medical History  Diagnosis Date  . Pregnancy induced hypertension   . Urinary tract infection   . Keloid   . Goiter     Dr. Shawna Orleans in [place]  . HPV in female 2009    Past Surgical History  Procedure Laterality Date  . Cesarean section      Family History  Problem Relation Age of Onset  . Anesthesia problems Neg Hx   . Diabetes Father   . Heart disease Father     History   Social History  . Marital Status: Married    Spouse Name: N/A    Number of Children: N/A  . Years of Education: N/A   Occupational History  . Not on file.   Social History Main Topics  . Smoking status: Never Smoker   . Smokeless tobacco: Never Used  . Alcohol Use: No  . Drug Use: No  . Sexual Activity:    Partners: Male    Birth Control/ Protection: None   Other Topics Concern  . Not on file   Social History Narrative   Review of Systems Appetite is okay Feels sluggish with this headache---and since the thyroidectomy (3 months  ago) Some allergy symptoms--vague about this.  No vision changes with this Sleeps okay when in bed---but limited time in bed (due to work, etc)    Objective:   Physical Exam  Constitutional: She is oriented to person, place, and time. She appears well-developed and well-nourished. No distress.  HENT:  Mouth/Throat: Oropharynx is clear and moist. No oropharyngeal exudate.  Mild pale nasal congestion TMs normal Mild frontal tenderness  Eyes: Conjunctivae and EOM are normal. Pupils are equal, round, and reactive to light.  No papilledema  Neck: Normal range of motion. Neck supple. No thyromegaly present.  Pulmonary/Chest: Effort normal and breath sounds normal. No respiratory distress. She has no wheezes. She has no rales.  Musculoskeletal: She exhibits no edema or tenderness.  Lymphadenopathy:    She has no cervical adenopathy.  Neurological: She is alert and oriented to person, place, and time. No cranial nerve deficit. Coordination normal.  No weakness          Assessment & Plan:

## 2013-12-11 NOTE — Patient Instructions (Signed)
Please try 3 ibuprofen tonight and get enough sleep. You can take this up to 3 times per day. If you continue to have feelings of congestion, you can try cetirizine 41m or fexofenadine 181mdaily (available over the counter)

## 2013-12-11 NOTE — Assessment & Plan Note (Signed)
Nothing to suggest migraine Not true tension headache type ---but may be closest to this. Not sleeping enough Some pressure and congestion--esp with bending over. Not really sick now--don't know if there could be some allergy component (but she doesn't really endorse this much) I doubt the BP is related to this (may be up due to headache--not the other way around)  Discussed getting enough sleep while working extra time (5:30A-8P) Done with her classes till January --this might help  Will write her out of work till 12/9 Try increased sleep and ibuprofen

## 2013-12-11 NOTE — Progress Notes (Signed)
Pre visit review using our clinic review tool, if applicable. No additional management support is needed unless otherwise documented below in the visit note. 

## 2013-12-18 ENCOUNTER — Ambulatory Visit: Payer: Commercial Managed Care - PPO | Admitting: Family Medicine

## 2014-01-22 ENCOUNTER — Ambulatory Visit: Payer: Self-pay | Admitting: Family Medicine

## 2014-02-05 ENCOUNTER — Ambulatory Visit (INDEPENDENT_AMBULATORY_CARE_PROVIDER_SITE_OTHER): Payer: BLUE CROSS/BLUE SHIELD | Admitting: Family Medicine

## 2014-02-05 ENCOUNTER — Encounter: Payer: Self-pay | Admitting: Family Medicine

## 2014-02-05 ENCOUNTER — Encounter: Payer: Self-pay | Admitting: *Deleted

## 2014-02-05 ENCOUNTER — Ambulatory Visit: Payer: Self-pay | Admitting: Family Medicine

## 2014-02-05 VITALS — BP 144/92 | HR 57 | Temp 98.0°F | Wt 266.0 lb

## 2014-02-05 DIAGNOSIS — Z9889 Other specified postprocedural states: Secondary | ICD-10-CM

## 2014-02-05 DIAGNOSIS — E890 Postprocedural hypothyroidism: Secondary | ICD-10-CM

## 2014-02-05 DIAGNOSIS — E039 Hypothyroidism, unspecified: Secondary | ICD-10-CM | POA: Insufficient documentation

## 2014-02-05 DIAGNOSIS — I10 Essential (primary) hypertension: Secondary | ICD-10-CM

## 2014-02-05 DIAGNOSIS — R519 Headache, unspecified: Secondary | ICD-10-CM

## 2014-02-05 DIAGNOSIS — R51 Headache: Secondary | ICD-10-CM

## 2014-02-05 MED ORDER — HYDROCHLOROTHIAZIDE 12.5 MG PO CAPS: 12.5000 mg | ORAL_CAPSULE | Freq: Every day | ORAL | Status: DC

## 2014-02-05 NOTE — Progress Notes (Signed)
Pre visit review using our clinic review tool, if applicable. No additional management support is needed unless otherwise documented below in the visit note. 

## 2014-02-05 NOTE — Assessment & Plan Note (Signed)
On thyroid replacement.  See is concerned she is hypothyroid. Will check thyroid function labs when we check electrolytes and kidney function at her follow up appt in 2 weeks. The patient indicates understanding of these issues and agrees with the plan.

## 2014-02-05 NOTE — Progress Notes (Signed)
   Subjective:   Patient ID: Gloria Flynn, female    DOB: [birth year], 44 y.o.   MRN: [id number]  Aliece Honold is a pleasant 44 y.o. year old female with h/o post thyroidectomy hypothyroidism on thyroid replacement, who presents to clinic today with Headache  on 02/05/2014  HPI: HA- ongoing for months.  Has seen Webb Silversmith (08/2013) and Dr. Silvio Pate (12/11/13)- notes reviewed.  Rollene Fare felt they were tension HA- given IM toradol and advised po NSAIDS.  Dr. Silvio Pate thought maybe tension headache but not sure- ? Some allergy.  HA are intermittent- can be uni lateral or bilateral.  Not associated with aura, nausea, vomiting, photophonia or phonophobia.  BP has been elevated past several visits.   BP Readings from Last 3 Encounters:  02/05/14 144/92  12/11/13 148/90  10/26/13 144/92   Hypothyroidism. -post thyroidectomy hypothyroidism on thyroid replacement- 100 mcg daily. Lab Results  Component Value Date   TSH 3.74 10/26/2013   Has gained weight- feels tired. Wt Readings from Last 3 Encounters:  02/05/14 266 lb (120.657 kg)  12/11/13 264 lb (119.75 kg)  10/26/13 266 lb (120.657 kg)     Review of Systems  Constitutional: Positive for fatigue.  Eyes: Negative.   Respiratory: Negative.   Cardiovascular: Negative.   Musculoskeletal: Negative.   Skin: Negative.   Neurological: Positive for headaches. Negative for dizziness, tremors, seizures, syncope, facial asymmetry, speech difficulty, weakness, light-headedness and numbness.  Psychiatric/Behavioral: Negative.   All other systems reviewed and are negative.      Objective:    BP 144/92 mmHg  Pulse 57  Temp(Src) 98 F (36.7 C) (Oral)  Wt 266 lb (120.657 kg)  SpO2 99%  LMP 02/03/2014    Physical Exam  Constitutional: She is oriented to person, place, and time. She appears well-developed and well-nourished.  overweight  HENT:  Head: Normocephalic.  Eyes: Conjunctivae are normal.  Neck:  Horizontal well healed  thyroidectomy scar  Cardiovascular: Normal rate, regular rhythm and normal heart sounds.   Pulmonary/Chest: Effort normal and breath sounds normal. No respiratory distress. She has no wheezes.  Musculoskeletal: She exhibits no edema.  Neurological: She is alert and oriented to person, place, and time. No cranial nerve deficit.  Skin: Skin is warm and dry.  Psychiatric: She has a normal mood and affect. Her behavior is normal. Judgment and thought content normal.          Assessment & Plan:   Headache, unspecified headache type  Essential hypertension  S/P thyroidectomy  Postoperative hypothyroidism - Plan: CANCELED: TSH, CANCELED: T4, Free, CANCELED: T3, Free No Follow-up on file.

## 2014-02-05 NOTE — Assessment & Plan Note (Signed)
Persistent- Agree likely tension but I question if elevated BP is playing a roll. BP has been elevated last several visits. Will start her on HCTZ 12.5 mg daily- follow up in 2 weeks.

## 2014-02-05 NOTE — Patient Instructions (Signed)
Good to see you. We will call you with you with your lab results.  We are starting hydrochlorothiazide 12.5 mg daily. Please come see me in 2 weeks.

## 2014-02-05 NOTE — Assessment & Plan Note (Signed)
New- see below.

## 2014-02-15 ENCOUNTER — Telehealth: Payer: Self-pay | Admitting: Family Medicine

## 2014-02-15 NOTE — Telephone Encounter (Signed)
Patient has appointment with Dr.Aron on 02/27/14.  Patient wants to know if she can come in for lab work before her appointment due to her work schedule.  Please call patient.

## 2014-02-16 ENCOUNTER — Other Ambulatory Visit (INDEPENDENT_AMBULATORY_CARE_PROVIDER_SITE_OTHER): Payer: BLUE CROSS/BLUE SHIELD

## 2014-02-16 ENCOUNTER — Other Ambulatory Visit: Payer: Self-pay | Admitting: Internal Medicine

## 2014-02-16 DIAGNOSIS — E039 Hypothyroidism, unspecified: Secondary | ICD-10-CM

## 2014-02-16 DIAGNOSIS — I519 Heart disease, unspecified: Principal | ICD-10-CM

## 2014-02-16 LAB — T4, FREE: Free T4: 1.31 ng/dL (ref 0.80–1.80)

## 2014-02-16 LAB — COMPREHENSIVE METABOLIC PANEL
ALK PHOS: 56 U/L (ref 39–117)
ALT: 17 U/L (ref 0–35)
AST: 22 U/L (ref 0–37)
Albumin: 4.4 g/dL (ref 3.5–5.2)
BUN: 10 mg/dL (ref 6–23)
CO2: 28 mEq/L (ref 19–32)
CREATININE: 0.79 mg/dL (ref 0.50–1.10)
Calcium: 9.3 mg/dL (ref 8.4–10.5)
Chloride: 100 mEq/L (ref 96–112)
Glucose, Bld: 81 mg/dL (ref 70–99)
Potassium: 3.7 mEq/L (ref 3.5–5.3)
Sodium: 137 mEq/L (ref 135–145)
Total Bilirubin: 0.4 mg/dL (ref 0.2–1.2)
Total Protein: 7.6 g/dL (ref 6.0–8.3)

## 2014-02-16 LAB — TSH: TSH: 6.741 u[IU]/mL — ABNORMAL HIGH (ref 0.350–4.500)

## 2014-02-16 LAB — T3, FREE: T3 FREE: 2.6 pg/mL (ref 2.3–4.2)

## 2014-02-16 NOTE — Telephone Encounter (Signed)
Future labs have been ordered

## 2014-02-16 NOTE — Telephone Encounter (Signed)
Thyroid, electrolytes and kidney function to be rechecked at 2wk f/u per note

## 2014-02-16 NOTE — Telephone Encounter (Signed)
Spoke to pt and scheduled labs

## 2014-02-17 ENCOUNTER — Encounter: Payer: Self-pay | Admitting: Family Medicine

## 2014-02-20 MED ORDER — LEVOTHYROXINE SODIUM 112 MCG PO TABS: 112.0000 ug | ORAL_TABLET | Freq: Every day | ORAL | Status: DC

## 2014-02-20 NOTE — Addendum Note (Signed)
Addended by: Daralene Milch C on: 02/20/2014 11:35 AM   Modules accepted: Orders

## 2014-02-27 ENCOUNTER — Ambulatory Visit: Payer: BLUE CROSS/BLUE SHIELD | Admitting: Family Medicine

## 2014-02-27 ENCOUNTER — Telehealth: Payer: Self-pay | Admitting: Family Medicine

## 2014-02-27 NOTE — Telephone Encounter (Signed)
Patient did not come for their scheduled appointment today for follow up Please let me know if the patient needs to be contacted immediately for follow up or if no follow up is necessary.

## 2014-03-05 ENCOUNTER — Encounter: Payer: Self-pay | Admitting: Family Medicine

## 2014-03-05 ENCOUNTER — Ambulatory Visit (INDEPENDENT_AMBULATORY_CARE_PROVIDER_SITE_OTHER): Payer: BLUE CROSS/BLUE SHIELD | Admitting: Family Medicine

## 2014-03-05 VITALS — BP 144/90 | HR 90 | Temp 98.2°F | Ht 65.0 in | Wt 258.0 lb

## 2014-03-05 DIAGNOSIS — J020 Streptococcal pharyngitis: Secondary | ICD-10-CM

## 2014-03-05 DIAGNOSIS — J029 Acute pharyngitis, unspecified: Secondary | ICD-10-CM | POA: Insufficient documentation

## 2014-03-05 LAB — POCT RAPID STREP A (OFFICE): Rapid Strep A Screen: POSITIVE — AB

## 2014-03-05 MED ORDER — AMOXICILLIN 500 MG PO CAPS: 500.0000 mg | ORAL_CAPSULE | Freq: Three times a day (TID) | ORAL | Status: DC

## 2014-03-05 NOTE — Patient Instructions (Signed)
Drink lots of fluids and rest  Take amoxicillin as directed  Ibuprofen or tylenol for sore throat and gargling   Wear a mask to work tomorrow

## 2014-03-05 NOTE — Assessment & Plan Note (Signed)
With faint pos rapid strep test  Cover with amoxicillin  Disc symptomatic care - see instructions on AVS  Also fluids and rest  Update if not starting to improve in a week or if worsening

## 2014-03-05 NOTE — Progress Notes (Signed)
Pre visit review using our clinic review tool, if applicable. No additional management support is needed unless otherwise documented below in the visit note. 

## 2014-03-05 NOTE — Progress Notes (Signed)
Subjective:    Patient ID: Gloria Flynn, female    DOB: Nov 15, 1970, 44 y.o.   MRN: [id number]  HPI Here with ST since Thursday   Tried salt water gargle and chloraseptic  ST is severe  No strep contacts  Has had a cough and runny nose   Fever with chills and sweats (did not take her temp)   Has been in bed since Thursday   Patient Active Problem List   Diagnosis Date Noted  . Headache 02/05/2014  . HTN (hypertension) 02/05/2014  . Hypothyroidism 02/05/2014  . Frontal headache 12/11/2013  . S/P thyroidectomy 10/26/2013  . Papillary carcinoma of thyroid 10/26/2013  . Wrist pain 12/13/2012  . Tennis elbow 12/13/2012  . Obesity 06/25/2011  . Goiter 06/25/2011  . Missed abortion 02/03/2011   Past Medical History  Diagnosis Date  . Pregnancy induced hypertension   . Urinary tract infection   . Keloid   . Goiter     Dr. Shawna Orleans in [place]  . HPV in female 2009   Past Surgical History  Procedure Laterality Date  . Cesarean section     History  Substance Use Topics  . Smoking status: Never Smoker   . Smokeless tobacco: Never Used  . Alcohol Use: No   Family History  Problem Relation Age of Onset  . Anesthesia problems Neg Hx   . Diabetes Father   . Heart disease Father    Allergies  Allergen Reactions  . Tessalon [Benzonatate]     Dizzy, drowsy and h/a   Current Outpatient Prescriptions on File Prior to Visit  Medication Sig Dispense Refill  . hydrochlorothiazide (MICROZIDE) 12.5 MG capsule Take 1 capsule (12.5 mg total) by mouth daily. 30 capsule 0  . levothyroxine (SYNTHROID, LEVOTHROID) 112 MCG tablet Take 1 tablet (112 mcg total) by mouth daily. 30 tablet 1   No current facility-administered medications on file prior to visit.      Review of Systems Review of Systems  Constitutional: Negative for , appetite change,  and unexpected weight change. pos for malaise ENT pos for ST and rhinorrhea, neg for sinus pain  Eyes: Negative for pain and visual  disturbance.  Respiratory: Negative for wheeze and shortness of breath.   Cardiovascular: Negative for cp or palpitations    Gastrointestinal: Negative for nausea, diarrhea and constipation.  Genitourinary: Negative for urgency and frequency.  Skin: Negative for pallor or rash   Neurological: Negative for weakness, light-headedness, numbness and headaches.  Hematological: Negative for adenopathy. Does not bruise/bleed easily.  Psychiatric/Behavioral: Negative for dysphoric mood. The patient is not nervous/anxious.         Objective:   Physical Exam  Constitutional: She appears well-developed and well-nourished. No distress.  obese and well appearing   HENT:  Head: Normocephalic and atraumatic.  Right Ear: External ear normal.  Left Ear: External ear normal.  Mouth/Throat: No oropharyngeal exudate.  Diffuse throat injection with tonsil swelling bilaterally  Scant exudate on L tonsil  Nares boggy with some clear rhinorrhea   Eyes: Conjunctivae and EOM are normal. Pupils are equal, round, and reactive to light. Right eye exhibits no discharge. Left eye exhibits no discharge.  Neck: Normal range of motion. Neck supple.  Cardiovascular: Normal rate and regular rhythm.   Pulmonary/Chest: Breath sounds normal. No respiratory distress. She has no wheezes. She has no rales.  Musculoskeletal: She exhibits no tenderness.  Lymphadenopathy:    She has no cervical adenopathy.  Neurological: She is alert. No cranial nerve  deficit.  Skin: Skin is warm and dry. No rash noted.  Psychiatric: She has a normal mood and affect.          Assessment & Plan:   Problem List Items Addressed This Visit      Respiratory   Acute pharyngitis - Primary    With faint pos rapid strep test  Cover with amoxicillin  Disc symptomatic care - see instructions on AVS  Also fluids and rest  Update if not starting to improve in a week or if worsening          Relevant Orders   POCT rapid strep A  (Completed)

## 2014-03-20 ENCOUNTER — Ambulatory Visit: Payer: BLUE CROSS/BLUE SHIELD | Admitting: Primary Care

## 2014-03-21 ENCOUNTER — Ambulatory Visit (INDEPENDENT_AMBULATORY_CARE_PROVIDER_SITE_OTHER): Payer: BLUE CROSS/BLUE SHIELD | Admitting: Sports Medicine

## 2014-03-21 VITALS — BP 134/82 | HR 82 | Temp 97.7°F | Resp 18 | Ht 65.0 in | Wt 262.8 lb

## 2014-03-21 DIAGNOSIS — H109 Unspecified conjunctivitis: Secondary | ICD-10-CM

## 2014-03-21 MED ORDER — CIPROFLOXACIN HCL 0.3 % OP SOLN: OPHTHALMIC | Status: DC

## 2014-03-21 NOTE — Progress Notes (Addendum)
  Gloria Flynn - 44 y.o. female MRN [id number]  Date of birth: [birth year]  SUBJECTIVE:  Including CC & ROS.  URI HPI: Onset of symptoms: 3 Days Symptoms include: yes Nasal congestion, yes nasal drainage , color of drainage is clear, no sore throat, no fullness in the ears, bilateral eye redness, greenish yellow drainage bilateral, yes sinus pressure, yes light sensitivity and headache, no fever, no chills, no bodyache, yes fatigue,  dry cough, no mucous, no SOB. no history of tobacco use, no history of asthma, no history of COPD, yes history seasonal allergies. Denies Nausea, vomiting, diarrhea.  Appetite no, and Drinking fluids Relieving factors: saline drop Symptoms not improving but no worse Vital signs reviewed: Normal respirations, normal pulse ox, normal temperature, normal pulse  No exposure to sick contact   ROS:  Constitutional:  No fever, chills, or fatigue.  Respiratory:  No shortness of breath, cough, or wheezing Cardiovascular:  No palpitations, chest pain or syncope Gastrointestinal:  No nausea, no abdominal pain Review of systems otherwise negative except for what is stated in HPI  HISTORY: Past Medical, Surgical, Social, and Family History Reviewed & Updated per EMR. Pertinent Historical Findings include: HTN, hypothyroidism s/p thyroidectomy for thyroid goiter beign  PHYSICAL EXAM:  VS: BP:134/82 mmHg  HR:82bpm  TEMP:97.7 F (36.5 C)(Oral)  RESP:100 %  HT:5' 5"  (165.1 cm)   WT:262 lb 12.8 oz (119.205 kg)  BMI:43.8 PHYSICAL EXAM: General:  Alert and oriented, No acute distress.   HENT:  Normocephalic, Oral mucosa is moist. Eyes are equal and reactive to light, bilateral erythemeous conjunctivae with pus draiange, normal hearing, mucous membrane is moist, no erythema, no exudate.  Bilateral ears have fluid with bulging but no erythema, TMs are intact.  Both nasal passages are inflamed, erythematous, with clear drainage and inflamed turbinate.  Sinus passages are tender  to palpation.  Submandibular glands are fluctuant mobile and soft. Respiratory:  Lungs are clear to auscultation, Respirations are non-labored, Symmetrical chest wall expansion.   Cardiovascular:  Normal rate, Regular rhythm, No murmur, Good pulses equal in all extremities, No edema.   Gastrointestinal:  Soft, Non-tender, Non-distended, Normal bowel sounds, No organomegaly.   Integumentary:  Warm, Dry, No rash.   Neurologic:  Alert, Oriented, No focal defects Psychiatric:  Cooperative, Appropriate mood & affect.    ASSESSMENT & PLAN:  Impression Bilateral bacterial conjunctivitis  HX HTN  Recommendations: -Started patient on ciprofloxacin ophthalmic drops for the next week -Recommended symptomatic treatment with over-the-counter agents for other symptoms. -Encouraged patient to keep using over-the-counter saline drops or irritation control and to soothe eye irritation -Discuss her blood pressure is well-controlled and will continue on HCTZ   . The recorded information has been reviewed and considered. Agree with A/P. Dr Marin Comment

## 2014-03-26 ENCOUNTER — Other Ambulatory Visit: Payer: Self-pay | Admitting: Family Medicine

## 2014-03-26 ENCOUNTER — Encounter: Payer: Self-pay | Admitting: Family Medicine

## 2014-03-26 ENCOUNTER — Ambulatory Visit (INDEPENDENT_AMBULATORY_CARE_PROVIDER_SITE_OTHER): Payer: BLUE CROSS/BLUE SHIELD | Admitting: Family Medicine

## 2014-03-26 VITALS — BP 120/76 | HR 83 | Temp 98.6°F | Ht 65.0 in | Wt 262.2 lb

## 2014-03-26 DIAGNOSIS — H109 Unspecified conjunctivitis: Secondary | ICD-10-CM

## 2014-03-26 DIAGNOSIS — S058X2A Other injuries of left eye and orbit, initial encounter: Secondary | ICD-10-CM

## 2014-03-26 DIAGNOSIS — S0592XA Unspecified injury of left eye and orbit, initial encounter: Secondary | ICD-10-CM

## 2014-03-26 DIAGNOSIS — E890 Postprocedural hypothyroidism: Secondary | ICD-10-CM

## 2014-03-26 LAB — T4, FREE: FREE T4: 0.85 ng/dL (ref 0.60–1.60)

## 2014-03-26 LAB — T3, FREE: T3 FREE: 2.2 pg/mL — AB (ref 2.3–4.2)

## 2014-03-26 LAB — TSH: TSH: 3.33 u[IU]/mL (ref 0.35–4.50)

## 2014-03-26 NOTE — Progress Notes (Signed)
Pre visit review using our clinic review tool, if applicable. No additional management support is needed unless otherwise documented below in the visit note. 

## 2014-03-26 NOTE — Progress Notes (Signed)
   Dr. Frederico Hamman T. Ambert Virrueta, MD, [facility] [place], [address] Phone: [phone] Fax: [phone]  03/26/2014  Patient: Gloria Flynn, MRN: [id number], DOB: 08-25-1970, 44 y.o.  Primary Physician:  Arnette Norris, MD  Chief Complaint: Conjunctivitis  Subjective:   Gloria Flynn is a 44 y.o. very pleasant female patient who presents with the following:  The patient was seen on 03/21/2014 at urgent medical and was diagnosed with bilateral conjunctivitis. Patient was given some Cipro eyedrops, but she followed up today after she has had worsening pain primarily in the left side. Her eye is essentially red throughout the conjunctiva. It is painful when she is having photophobia.  B eye pain.  L medial globe scratch to the conjunctiva  Past Medical History, Surgical History, Social History, Family History, Problem List, Medications, and Allergies have been reviewed and updated if relevant.  ROS: GEN: Acute illness details above GI: Tolerating PO intake GU: maintaining adequate hydration and urination Pulm: No SOB Interactive and getting along well at home.  Otherwise, ROS is as per the HPI.   Objective:   BP 120/76 mmHg  Pulse 83  Temp(Src) 98.6 F (37 C) (Oral)  Ht 5' 5"  (1.651 m)  Wt 262 lb 4 oz (118.956 kg)  BMI 43.64 kg/m2  LMP 02/28/2014   GEN: WDWN, NAD, Non-toxic, Alert & Oriented x 3 HEENT: Atraumatic, Normocephalic. The patient's eyes are injected and on the left side it is quite red throughout the conjunctiva. She is clearly photophobic. PERRLA, EOMI.  Fluroscein exam: Shows a large medial abrasion on the patient's conjunctiva. It does not appear to be on the cornea itself.  Ears and Nose: No external deformity. EXTR: No clubbing/cyanosis/edema NEURO: Normal gait.  PSYCH: Normally interactive. Conversant. Not depressed or anxious appearing.  Calm demeanor.     Laboratory and Imaging  Data:  Assessment and Plan:   Eye abrasion, left, initial encounter - Plan: Ambulatory referral to Ophthalmology  Postoperative hypothyroidism - Plan: TSH, T4, Free, T3, Free  Bilateral conjunctivitis  I explained to the patient these cases can be quite cumbersome and potentially hazardous to her eye and I side. I am going to arrange for urgent ophthalmologic evaluation today.  Orders Placed This Encounter  Procedures  . Ambulatory referral to Ophthalmology    Signed,  Frederico Hamman T. Jamire Shabazz, MD   Patient's Medications  New Prescriptions   No medications on file  Previous Medications   CIPROFLOXACIN (CILOXAN) 0.3 % OPHTHALMIC SOLUTION    Administer 1 drop, every 2 hours, while awake, for 2 days. Then 1 drop, every 4 hours, while awake, for the next 5 days.   HYDROCHLOROTHIAZIDE (MICROZIDE) 12.5 MG CAPSULE    Take 1 capsule (12.5 mg total) by mouth daily.   LEVOTHYROXINE (SYNTHROID, LEVOTHROID) 112 MCG TABLET    Take 1 tablet (112 mcg total) by mouth daily.  Modified Medications   No medications on file  Discontinued Medications   No medications on file

## 2014-04-04 ENCOUNTER — Other Ambulatory Visit: Payer: Self-pay

## 2014-04-04 MED ORDER — LEVOTHYROXINE SODIUM 112 MCG PO TABS: 112.0000 ug | ORAL_TABLET | Freq: Every day | ORAL | Status: DC

## 2014-04-04 NOTE — Telephone Encounter (Signed)
Left message for patient regarding her thyroid medication.

## 2014-04-04 NOTE — Telephone Encounter (Signed)
Pt left v/m returning call. Spoke with pt and advised as instructed. Pt voiced understanding.

## 2014-04-04 NOTE — Telephone Encounter (Signed)
Pt left v/m; last thyroid labs were normal;pt wants to know if will be on levothyroxine 112 mcg indefinitely and if so pt request increase in quantity # 90 and sent to [facility].Please advise.

## 2014-04-04 NOTE — Telephone Encounter (Signed)
Rx refilled as requested but I have no way of knowing if she will be on this dose indefinitely. We will need to check labs twice a year.

## 2014-04-12 ENCOUNTER — Encounter: Payer: Self-pay | Admitting: Primary Care

## 2014-04-12 ENCOUNTER — Ambulatory Visit (INDEPENDENT_AMBULATORY_CARE_PROVIDER_SITE_OTHER): Payer: BLUE CROSS/BLUE SHIELD | Admitting: Primary Care

## 2014-04-12 VITALS — BP 122/78 | HR 85 | Temp 97.5°F | Ht 65.0 in | Wt 266.8 lb

## 2014-04-12 DIAGNOSIS — H578 Other specified disorders of eye and adnexa: Secondary | ICD-10-CM

## 2014-04-12 DIAGNOSIS — H5789 Other specified disorders of eye and adnexa: Secondary | ICD-10-CM

## 2014-04-12 DIAGNOSIS — J309 Allergic rhinitis, unspecified: Secondary | ICD-10-CM

## 2014-04-12 MED ORDER — FLUTICASONE PROPIONATE 50 MCG/ACT NA SUSP: 2.0000 | Freq: Every day | NASAL | Status: DC

## 2014-04-12 MED ORDER — TOBRAMYCIN-DEXAMETHASONE 0.3-0.1 % OP OINT: 1.0000 "application " | TOPICAL_OINTMENT | Freq: Three times a day (TID) | OPHTHALMIC | Status: DC

## 2014-04-12 NOTE — Progress Notes (Signed)
Subjective:    Patient ID: Gloria Flynn, female    DOB: 1970-04-12, 44 y.o.   MRN: [id number]  HPI  Gloria Flynn is a 44 year old female who presents today with a chief complaint of redness, pain, itching, and discharge to right eye. Her symptoms began on March 16th when she was seen at an Urgent Care for the same symptoms of her left eye. She was treated with Ciprodex without resolve and was then referred to an opthalmologist and evaluated on March 21st. She was provided with a prescription for tobradex which helped to resolve her left eye issues. Over the past week the symptoms have now developed to her right eye. She's also reporting nasal congestion and dry cough for the same duration of her eye symptoms. Denies fevers, nausea, sore throat.  Review of Systems  Constitutional: Positive for chills. Negative for fever.  HENT: Positive for congestion. Negative for sinus pressure and sore throat.   Eyes: Positive for photophobia, pain, discharge, redness and itching. Negative for visual disturbance.  Respiratory: Positive for cough. Negative for shortness of breath.        Non productive  Cardiovascular: Negative for chest pain.  Gastrointestinal: Negative for nausea and vomiting.  Musculoskeletal: Negative for myalgias.       Past Medical History  Diagnosis Date  . Pregnancy induced hypertension   . Urinary tract infection   . Keloid   . Goiter     Dr. Shawna Orleans in [place]  . HPV in female 2009    History   Social History  . Marital Status: Married    Spouse Name: N/A  . Number of Children: N/A  . Years of Education: N/A   Occupational History  . Not on file.   Social History Main Topics  . Smoking status: Never Smoker   . Smokeless tobacco: Never Used  . Alcohol Use: No  . Drug Use: No  . Sexual Activity:    Partners: Male    Birth Control/ Protection: None   Other Topics Concern  . Not on file   Social History Narrative    Past Surgical History  Procedure  Laterality Date  . Cesarean section    . Thyroid surgery  09/22/2013    Family History  Problem Relation Age of Onset  . Anesthesia problems Neg Hx   . Diabetes Father   . Heart disease Father     Allergies  Allergen Reactions  . Tessalon [Benzonatate]     Dizzy, drowsy and h/a    Current Outpatient Prescriptions on File Prior to Visit  Medication Sig Dispense Refill  . ciprofloxacin (CILOXAN) 0.3 % ophthalmic solution Administer 1 drop, every 2 hours, while awake, for 2 days. Then 1 drop, every 4 hours, while awake, for the next 5 days. 5 mL 0  . hydrochlorothiazide (MICROZIDE) 12.5 MG capsule Take 1 capsule (12.5 mg total) by mouth daily. 30 capsule 0  . levothyroxine (SYNTHROID, LEVOTHROID) 112 MCG tablet Take 1 tablet (112 mcg total) by mouth daily. 90 tablet 1   No current facility-administered medications on file prior to visit.    BP 122/78 mmHg  Pulse 85  Temp(Src) 97.5 F (36.4 C) (Oral)  Ht 5' 5"  (1.651 m)  Wt 266 lb 12.8 oz (121.02 kg)  BMI 44.40 kg/m2  SpO2 99%  LMP 04/09/2014    Objective:   Physical Exam  Constitutional: She is oriented to person, place, and time. She appears well-developed.  HENT:  Right Ear: External  ear normal.  Left Ear: External ear normal.  Nose: Nose normal.  Mouth/Throat: Oropharynx is clear and moist.  Eyes: Lids are normal. Pupils are equal, round, and reactive to light. Right conjunctiva is injected. Right eye exhibits normal extraocular motion and no nystagmus. Left eye exhibits normal extraocular motion and no nystagmus.  Tearing noted to right eye, no discolored drainage.   Neck: Neck supple.  Cardiovascular: Normal rate and regular rhythm.   Pulmonary/Chest: Effort normal and breath sounds normal.  Lymphadenopathy:    She has no cervical adenopathy.  Neurological: She is alert and oriented to person, place, and time.  Skin: Skin is warm and dry.          Assessment & Plan:  This appears to be conjunctivitis  that most likely spread from her left eye to her right.  Left eye is unremarkable. Right eye injected with tearing. Tobradex drops provided TID to both eyes. Flonase sent for nasal congestion. Follow up if no improvement in the next week.

## 2014-04-12 NOTE — Progress Notes (Signed)
Pre visit review using our clinic review tool, if applicable. No additional management support is needed unless otherwise documented below in the visit note. 

## 2014-04-12 NOTE — Patient Instructions (Signed)
Use eye drops to both eyes three times daily. Use Flonase daily to help with nasal congestion. Follow up if no improvement in the next 1 week. I hope you feel better soon!

## 2014-09-14 ENCOUNTER — Encounter: Payer: Self-pay | Admitting: Family Medicine

## 2014-09-14 ENCOUNTER — Other Ambulatory Visit: Payer: Self-pay | Admitting: *Deleted

## 2014-09-14 ENCOUNTER — Ambulatory Visit (INDEPENDENT_AMBULATORY_CARE_PROVIDER_SITE_OTHER): Payer: BLUE CROSS/BLUE SHIELD | Admitting: Family Medicine

## 2014-09-14 ENCOUNTER — Encounter: Payer: Self-pay | Admitting: *Deleted

## 2014-09-14 VITALS — BP 118/82 | HR 72 | Temp 98.1°F | Wt 267.5 lb

## 2014-09-14 DIAGNOSIS — E890 Postprocedural hypothyroidism: Secondary | ICD-10-CM | POA: Diagnosis not present

## 2014-09-14 DIAGNOSIS — I10 Essential (primary) hypertension: Secondary | ICD-10-CM

## 2014-09-14 DIAGNOSIS — R51 Headache: Secondary | ICD-10-CM | POA: Diagnosis not present

## 2014-09-14 DIAGNOSIS — R519 Headache, unspecified: Secondary | ICD-10-CM

## 2014-09-14 LAB — CBC WITH DIFFERENTIAL/PLATELET
BASOS ABS: 0 10*3/uL (ref 0.0–0.1)
BASOS PCT: 1 % (ref 0–1)
EOS ABS: 0 10*3/uL (ref 0.0–0.7)
Eosinophils Relative: 1 % (ref 0–5)
HCT: 37.6 % (ref 36.0–46.0)
HEMOGLOBIN: 12 g/dL (ref 12.0–15.0)
Lymphocytes Relative: 50 % — ABNORMAL HIGH (ref 12–46)
Lymphs Abs: 2.5 10*3/uL (ref 0.7–4.0)
MCH: 26.8 pg (ref 26.0–34.0)
MCHC: 31.9 g/dL (ref 30.0–36.0)
MCV: 83.9 fL (ref 78.0–100.0)
MPV: 10.3 fL (ref 8.6–12.4)
Monocytes Absolute: 0.5 10*3/uL (ref 0.1–1.0)
Monocytes Relative: 10 % (ref 3–12)
NEUTROS ABS: 1.9 10*3/uL (ref 1.7–7.7)
NEUTROS PCT: 38 % — AB (ref 43–77)
PLATELETS: 284 10*3/uL (ref 150–400)
RBC: 4.48 MIL/uL (ref 3.87–5.11)
RDW: 14.9 % (ref 11.5–15.5)
WBC: 4.9 10*3/uL (ref 4.0–10.5)

## 2014-09-14 LAB — BASIC METABOLIC PANEL
BUN: 11 mg/dL (ref 7–25)
CALCIUM: 8.8 mg/dL (ref 8.6–10.2)
CHLORIDE: 105 mmol/L (ref 98–110)
CO2: 27 mmol/L (ref 20–31)
Creat: 0.88 mg/dL (ref 0.50–1.10)
Glucose, Bld: 72 mg/dL (ref 65–99)
POTASSIUM: 3.5 mmol/L (ref 3.5–5.3)
SODIUM: 144 mmol/L (ref 135–146)

## 2014-09-14 LAB — T4, FREE: Free T4: 1.07 ng/dL (ref 0.80–1.80)

## 2014-09-14 LAB — TSH: TSH: 10.455 u[IU]/mL — AB (ref 0.350–4.500)

## 2014-09-14 MED ORDER — HYDROCHLOROTHIAZIDE 12.5 MG PO CAPS: 12.5000 mg | ORAL_CAPSULE | Freq: Every day | ORAL | Status: DC | PRN

## 2014-09-14 NOTE — Progress Notes (Signed)
Pre visit review using our clinic review tool, if applicable. No additional management support is needed unless otherwise documented below in the visit note. 

## 2014-09-14 NOTE — Assessment & Plan Note (Addendum)
Mild frontal throbbing ache associated with intermittent dizziness and minimal vertigo. Not consistent with migraines.  Doubt blood pressure related as her BP is normal today - but I will refill her low dose HCTZ per her request today. ?stress related vs thyroid related - check TSH today. Check CBC to r/o anemia as cause of sxs. Discussed relation of energy drinks to possible worsening headaches - encouraged she back off this. nonfocal neurological exam today.  If unrevealing evaluation, consider PRN meclizine trial for ?vertigo. Pt agrees with plan, will update [place] if persistent headache.

## 2014-09-14 NOTE — Assessment & Plan Note (Signed)
Stable off hctz. Will refill mainly for PRN edema.

## 2014-09-14 NOTE — Patient Instructions (Addendum)
Blood pressure and exam today looking ok.  ?caffeine from energy drink related vs stress related vs thyroid - check labs today. We will call you with results and plan - but in the meantime drink plenty of water and let us know if persistent headaches.

## 2014-09-14 NOTE — Assessment & Plan Note (Signed)
Recheck thyroid level today.

## 2014-09-14 NOTE — Progress Notes (Signed)
BP 118/82 mmHg  Pulse 72  Temp(Src) 98.1 F (36.7 C) (Oral)  Wt 267 lb 8 oz (121.337 kg)  LMP 09/07/2014   CC: headache  Subjective:    Patient ID: Gloria Flynn, female    DOB: [birth year], 44 y.o.   MRN: [id number]  HPI: Gloria Flynn is a 44 y.o. female presenting on 09/14/2014 for Headache   2-3 wk h/o headache described as dizziness/lightheadedness when first awakening. Also some vertigo sensation. Lasts 30 min to 1 hr. No nausea. Some frontal throbbing pain, but main concern is dizziness. Today had headache at work. Endorses increased stress at home and work   No fevers/chills, no recent viral infection, sinus pressure/congestion, coughing, ear pain, tooth pain, sore throat. No vision changes, numbness or weakness.   Goody powder helps headache.  Remote h/o migraines. This does not feel like that.   She has misplaced hctz and has been off for about 5 months. Noticing increased L>R leg swelling (at knee and ankle) and mild discomfort at knee. Denies inciting trauma/injury.  Increased sodium in diet recently (fast foods).  Compliant with levothyroxine.   Feeling fatigued.  At first though headaches maybe bp related - but BP ok today. Actually does feel somewhat to how she felt when thyroid was underactive previously.   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   Relevant past medical, surgical, family and social history reviewed and updated as indicated. Interim medical history since our last visit reviewed. Allergies and medications reviewed and updated. Current Outpatient Prescriptions on File Prior to Visit  Medication Sig  . levothyroxine (SYNTHROID, LEVOTHROID) 112 MCG tablet Take 1 tablet (112 mcg total) by mouth daily.  . fluticasone (FLONASE) 50 MCG/ACT nasal spray Place 2 sprays into both nostrils daily. (Patient not taking: Reported on 09/14/2014)  . hydrochlorothiazide (MICROZIDE) 12.5 MG capsule Take 1 capsule (12.5 mg total)  by mouth daily. (Patient not taking: Reported on 09/14/2014)   No current facility-administered medications on file prior to visit.    Review of Systems Per HPI unless specifically indicated above     Objective:    BP 118/82 mmHg  Pulse 72  Temp(Src) 98.1 F (36.7 C) (Oral)  Wt 267 lb 8 oz (121.337 kg)  LMP 09/07/2014  Wt Readings from Last 3 Encounters:  09/14/14 267 lb 8 oz (121.337 kg)  04/12/14 266 lb 12.8 oz (121.02 kg)  03/26/14 262 lb 4 oz (118.956 kg)    Physical Exam  Constitutional: She appears well-developed and well-nourished. No distress.  HENT:  Head: Normocephalic and atraumatic.  Right Ear: Hearing, tympanic membrane, external ear and ear canal normal.  Left Ear: Hearing, tympanic membrane, external ear and ear canal normal.  Nose: No mucosal edema or rhinorrhea. Right sinus exhibits no maxillary sinus tenderness and no frontal sinus tenderness. Left sinus exhibits no maxillary sinus tenderness and no frontal sinus tenderness.  Mouth/Throat: Uvula is midline, oropharynx is clear and moist and mucous membranes are normal. No oropharyngeal exudate, posterior oropharyngeal edema, posterior oropharyngeal erythema or tonsillar abscesses.  Eyes: Conjunctivae and EOM are normal. Pupils are equal, round, and reactive to light. No scleral icterus.  Neck: Normal range of motion. Neck supple.  Cardiovascular: Normal rate, regular rhythm, normal heart sounds and intact distal pulses.   No murmur heard. Pulmonary/Chest: Effort normal and breath sounds normal. No respiratory distress. She has no wheezes. She has no rales.  Musculoskeletal: She exhibits edema (nonpitting edema bilaterally).  Lymphadenopathy:  She has no cervical adenopathy.  Neurological: She has normal strength. No cranial nerve deficit or sensory deficit. She displays a negative Romberg sign. Coordination normal.  CN 2-12 intact Station and gait intact FTN intact No pronator drift  Skin: Skin is warm and  dry. No rash noted.  Psychiatric: She has a normal mood and affect.  Nursing note and vitals reviewed.  Results for orders placed or performed in visit on 03/26/14  TSH  Result Value Ref Range   TSH 3.33 0.35 - 4.50 uIU/mL  T4, Free  Result Value Ref Range   Free T4 0.85 0.60 - 1.60 ng/dL  T3, Free  Result Value Ref Range   T3, Free 2.2 (L) 2.3 - 4.2 pg/mL      Assessment & Plan:   Problem List Items Addressed This Visit    Frontal headache - Primary    Mild frontal throbbing ache associated with intermittent dizziness and minimal vertigo. Not consistent with migraines.  Doubt blood pressure related as her BP is normal today - but I will refill her low dose HCTZ per her request today. ?stress related vs thyroid related - check TSH today. Check CBC to r/o anemia as cause of sxs. Discussed relation of energy drinks to possible worsening headaches - encouraged she back off this. nonfocal neurological exam today.  If unrevealing evaluation, consider PRN meclizine trial for ?vertigo. Pt agrees with plan, will update [place] if persistent headache.      Relevant Orders   CBC with Differential/Platelet   Basic metabolic panel   HTN (hypertension)    Stable off hctz. Will refill mainly for PRN edema.      Hypothyroidism    Recheck thyroid level today.      Relevant Orders   T4, free   TSH       Follow up plan: No Follow-up on file.

## 2014-09-16 ENCOUNTER — Other Ambulatory Visit: Payer: Self-pay | Admitting: Family Medicine

## 2014-09-16 DIAGNOSIS — I10 Essential (primary) hypertension: Secondary | ICD-10-CM

## 2014-09-16 DIAGNOSIS — E890 Postprocedural hypothyroidism: Secondary | ICD-10-CM

## 2014-09-16 MED ORDER — LEVOTHYROXINE SODIUM 125 MCG PO TABS: 125.0000 ug | ORAL_TABLET | Freq: Every day | ORAL | Status: DC

## 2014-09-16 MED ORDER — POTASSIUM CHLORIDE ER 10 MEQ PO TBCR: 10.0000 meq | EXTENDED_RELEASE_TABLET | Freq: Every day | ORAL | Status: DC | PRN

## 2014-09-21 ENCOUNTER — Ambulatory Visit (INDEPENDENT_AMBULATORY_CARE_PROVIDER_SITE_OTHER): Payer: BLUE CROSS/BLUE SHIELD | Admitting: Family Medicine

## 2014-09-21 ENCOUNTER — Encounter: Payer: Self-pay | Admitting: Family Medicine

## 2014-09-21 VITALS — BP 128/82 | HR 72 | Temp 97.9°F | Wt 268.5 lb

## 2014-09-21 DIAGNOSIS — R600 Localized edema: Secondary | ICD-10-CM | POA: Diagnosis not present

## 2014-09-21 DIAGNOSIS — E890 Postprocedural hypothyroidism: Secondary | ICD-10-CM | POA: Diagnosis not present

## 2014-09-21 NOTE — Progress Notes (Signed)
BP 128/82 mmHg  Pulse 72  Temp(Src) 97.9 F (36.6 C) (Oral)  Wt 268 lb 8 oz (121.791 kg)  LMP 09/07/2014   CC: pedal edema  Subjective:    Patient ID: Gloria Flynn, female    DOB: 1970-05-06, 44 y.o.   MRN: [id number]  HPI: Gloria Flynn is a 44 y.o. female presenting on 09/21/2014 for Edema   See prior note for details. Seen here 09/14/2014 with headache and dizziness. Thyroid was found to be underactive so we increased levothyroxine to 123mg daily. Possible cause of headaches.   She was previously on HCTZ PRN pedal edema. I also refilled this last visit - but she did not start as she was told it was a blood pressure medicine and she doesn't have high blood pressure. Again discussed this is for fluid as needed.  Over last 2-3 weeks noticing worsening swelling of bilateral feet and left knee. Also noticing some muscle cramps and spasms and left knee ache.   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 Work days are 4am to [employer] Lots of standing at work and school. Requests note for school to sit down during classes.   Relevant past medical, surgical, family and social history reviewed and updated as indicated. Interim medical history since our last visit reviewed. Allergies and medications reviewed and updated. Current Outpatient Prescriptions on File Prior to Visit  Medication Sig  . levothyroxine (SYNTHROID, LEVOTHROID) 125 MCG tablet Take 1 tablet (125 mcg total) by mouth daily.  . fluticasone (FLONASE) 50 MCG/ACT nasal spray Place 2 sprays into both nostrils daily. (Patient not taking: Reported on 09/14/2014)  . hydrochlorothiazide (MICROZIDE) 12.5 MG capsule Take 1 capsule (12.5 mg total) by mouth daily as needed. (Patient not taking: Reported on 09/21/2014)  . potassium chloride (K-DUR) 10 MEQ tablet Take 1 tablet (10 mEq total) by mouth daily as needed (with HCTZ). (Patient not taking: Reported on 09/21/2014)   No current facility-administered medications on file  prior to visit.   Past Medical History  Diagnosis Date  . Pregnancy induced hypertension   . Urinary tract infection   . Keloid   . [place]    Review of Systems Per HPI unless specifically indicated above     Objective:    BP 128/82 mmHg  Pulse 72  Temp(Src) 97.9 F (36.6 C) (Oral)  Wt 268 lb 8 oz (121.791 kg)  LMP 09/07/2014  Wt Readings from Last 3 Encounters:  09/21/14 268 lb 8 oz (121.791 kg)  09/14/14 267 lb 8 oz (121.337 kg)  04/12/14 266 lb 12.8 oz (121.02 kg)   Body mass index is 44.68 kg/(m^2).  Physical Exam  Constitutional: She appears well-developed and well-nourished. No distress.  Musculoskeletal: She exhibits edema (nonpitting pedal edema dorsal feet and ankles).  FROM at knees in extension/flexion without crepitus No pain to palpation of knee No effusion appreciated No popliteal fullness appreciated. No palpable cords 2+ DP bilaterally  Skin: Skin is warm and dry. No rash noted.  Psychiatric: She has a normal mood and affect.  Nursing note and vitals reviewed.  Results for orders placed or performed in visit on 09/14/14  T4, free  Result Value Ref Range   Free T4 1.07 0.80 - 1.80 ng/dL  TSH  Result Value Ref Range   TSH 10.455 (H) 0.350 - 4.500 uIU/mL  CBC with Differential/Platelet  Result Value Ref Range   WBC  4.9 4.0 - 10.5 K/uL   RBC 4.48 3.87 - 5.11 MIL/uL   Hemoglobin 12.0 12.0 - 15.0 g/dL   HCT 37.6 36.0 - 46.0 %   MCV 83.9 78.0 - 100.0 fL   MCH 26.8 26.0 - 34.0 pg   MCHC 31.9 30.0 - 36.0 g/dL   RDW 14.9 11.5 - 15.5 %   Platelets 284 150 - 400 K/uL   MPV 10.3 8.6 - 12.4 fL   Neutrophils Relative % 38 (L) 43 - 77 %   Neutro Abs 1.9 1.7 - 7.7 K/uL   Lymphocytes Relative 50 (H) 12 - 46 %   Lymphs Abs 2.5 0.7 - 4.0 K/uL   Monocytes Relative 10 3 - 12 %   Monocytes Absolute 0.5 0.1 - 1.0 K/uL   Eosinophils Relative 1 0 - 5 %   Eosinophils Absolute 0.0 0.0 - 0.7 K/uL   Basophils  Relative 1 0 - 1 %   Basophils Absolute 0.0 0.0 - 0.1 K/uL   Smear Review Criteria for review not met   Basic metabolic panel  Result Value Ref Range   Sodium 144 135 - 146 mmol/L   Potassium 3.5 3.5 - 5.3 mmol/L   Chloride 105 98 - 110 mmol/L   CO2 27 20 - 31 mmol/L   Glucose, Bld 72 65 - 99 mg/dL   BUN 11 7 - 25 mg/dL   Creat 0.88 0.50 - 1.10 mg/dL   Calcium 8.8 8.6 - 10.2 mg/dL      Assessment & Plan:   Problem List Items Addressed This Visit    Severe obesity (BMI >= 40)    Discussed phentermine use and importance of healthy diet/lifestyle changes to affect sustainable weight loss.  Pt receiving phentermine through bariatric weight loss clinic. Advised wait until pedal edema has resolved prior to starting phentermine.      Hypothyroidism    Tolerating higher levothyroxine dose. HA has resolved.      Pedal edema - Primary    No red flags today. Good pulses, no evidence of DVT. She had not started hctz. Advised it is ok to take hctz PRN swelling even if her blood pressure is ok. Pt will try this. Discussed potassium use with HCTZ. Discussed elevation of leg, avoiding salt and increasing water intake.          Follow up plan: Return if symptoms worsen or fail to improve.

## 2014-09-21 NOTE — Assessment & Plan Note (Signed)
Discussed phentermine use and importance of healthy diet/lifestyle changes to affect sustainable weight loss.  Pt receiving phentermine through bariatric weight loss clinic. Advised wait until pedal edema has resolved prior to starting phentermine.

## 2014-09-21 NOTE — Patient Instructions (Addendum)
Take potassium pill daily for 5 days. Start hydrochlorothiazide as needed for swelling - take potassium if you do take a water pill.  For leg swelling - keep legs elevated as much as able, avoid salt in diet, drink plenty of water. Take thyroid pill first thing in morning on empty stomach, don't eat or drink anything other than water for 30 min.  Return in 1 month for lab visit only to recheck thyroid. Schedule physical at your convenience over next several months with Dr Deborra Medina.

## 2014-09-21 NOTE — Assessment & Plan Note (Signed)
Tolerating higher levothyroxine dose. HA has resolved.

## 2014-09-21 NOTE — Progress Notes (Signed)
Pre visit review using our clinic review tool, if applicable. No additional management support is needed unless otherwise documented below in the visit note. 

## 2014-09-21 NOTE — Assessment & Plan Note (Addendum)
No red flags today. Good pulses, no evidence of DVT. She had not started hctz. Advised it is ok to take hctz PRN swelling even if her blood pressure is ok. Pt will try this. Discussed potassium use with HCTZ. Discussed elevation of leg, avoiding salt and increasing water intake.

## 2014-10-08 ENCOUNTER — Other Ambulatory Visit: Payer: Self-pay | Admitting: *Deleted

## 2014-10-08 MED ORDER — HYDROCHLOROTHIAZIDE 12.5 MG PO CAPS: 12.5000 mg | ORAL_CAPSULE | Freq: Every day | ORAL | Status: DC | PRN

## 2014-10-16 ENCOUNTER — Other Ambulatory Visit: Payer: Self-pay | Admitting: Family Medicine

## 2014-11-09 ENCOUNTER — Ambulatory Visit (INDEPENDENT_AMBULATORY_CARE_PROVIDER_SITE_OTHER): Payer: BLUE CROSS/BLUE SHIELD | Admitting: Emergency Medicine

## 2014-11-09 VITALS — BP 120/80 | HR 97 | Temp 98.0°F | Resp 16 | Ht 65.0 in | Wt 270.0 lb

## 2014-11-09 DIAGNOSIS — J209 Acute bronchitis, unspecified: Secondary | ICD-10-CM | POA: Diagnosis not present

## 2014-11-09 DIAGNOSIS — J0140 Acute pansinusitis, unspecified: Secondary | ICD-10-CM | POA: Diagnosis not present

## 2014-11-09 MED ORDER — PSEUDOEPHEDRINE-GUAIFENESIN ER 60-600 MG PO TB12: 1.0000 | ORAL_TABLET | Freq: Two times a day (BID) | ORAL | Status: AC

## 2014-11-09 MED ORDER — AMOXICILLIN-POT CLAVULANATE 875-125 MG PO TABS: 1.0000 | ORAL_TABLET | Freq: Two times a day (BID) | ORAL | Status: DC

## 2014-11-09 MED ORDER — HYDROCOD POLST-CPM POLST ER 10-8 MG/5ML PO SUER: 5.0000 mL | Freq: Two times a day (BID) | ORAL | Status: DC

## 2014-11-09 NOTE — Progress Notes (Signed)
Subjective:  Patient ID: Gloria Flynn, female    DOB: 05/03/1970  Age: 44 y.o. MRN: [id number]  CC: Sore Throat; Cough; Nasal Congestion; Generalized Body Aches; and Headache   HPI Gloria Flynn presents   With nasal congestion and postnasal drainage and purulent  In color. He has a sore throat and a cough productive of scant purulent sputum. He has no wheezing or shortness of breath. He has no fever chills. No nausea or vomiting or stool change. He's had no improvement with over-the-counter medication he feels very fatigued and is unable to work.  History [name] has a past medical history of Pregnancy induced hypertension; Urinary tract infection; Keloid; Goiter; and HPV in female (2009).   She has past surgical history that includes Cesarean section and Thyroid surgery (09/22/2013).   Her  family history includes Diabetes in her father; Heart disease in her father. There is no history of Anesthesia problems.  She   reports that she has never smoked. She has never used smokeless tobacco. She reports that she does not drink alcohol or use illicit drugs.  Outpatient Prescriptions Prior to Visit  Medication Sig Dispense Refill  . hydrochlorothiazide (MICROZIDE) 12.5 MG capsule Take 1 capsule (12.5 mg total) by mouth daily as needed. 90 capsule 2  . levothyroxine (SYNTHROID, LEVOTHROID) 125 MCG tablet Take 1 tablet (125 mcg total) by mouth daily. 90 tablet 1  . potassium chloride (K-DUR) 10 MEQ tablet Take 1 tablet (10 mEq total) by mouth daily as needed (with HCTZ). 30 tablet 1  . fluticasone (FLONASE) 50 MCG/ACT nasal spray Place 2 sprays into both nostrils daily. (Patient not taking: Reported on 09/14/2014) 16 g 6   No facility-administered medications prior to visit.    Social History   Social History  . Marital Status: Married    Spouse Name: N/A  . Number of Children: N/A  . Years of Education: N/A   Social History Main Topics  . Smoking status: Never Smoker   .  Smokeless tobacco: Never Used  . Alcohol Use: No  . Drug Use: No  . Sexual Activity:    Partners: Male    Birth Control/ Protection: None   Other Topics Concern  . None   Social History Narrative     Review of Systems  Constitutional: Positive for chills and fatigue. Negative for fever and appetite change.  HENT: Positive for postnasal drip, rhinorrhea, sinus pressure and sore throat. Negative for congestion and ear pain.   Eyes: Negative for pain and redness.  Respiratory: Positive for cough. Negative for shortness of breath and wheezing.   Cardiovascular: Negative for leg swelling.  Gastrointestinal: Negative for nausea, vomiting, abdominal pain, diarrhea, constipation and blood in stool.  Endocrine: Negative for polyuria.  Genitourinary: Negative for dysuria, urgency, frequency and flank pain.  Musculoskeletal: Negative for gait problem.  Skin: Negative for rash.  Neurological: Negative for weakness and headaches.  Psychiatric/Behavioral: Negative for confusion and decreased concentration. The patient is not nervous/anxious.     Objective:  BP 120/80 mmHg  Pulse 97  Temp(Src) 98 F (36.7 C) (Oral)  Resp 16  Ht 5' 5"  (1.651 m)  Wt 270 lb (122.471 kg)  BMI 44.93 kg/m2  SpO2 98%  LMP 11/02/2014 (Approximate)  Physical Exam  Constitutional: She is oriented to person, place, and time. She appears well-developed and well-nourished. No distress.  HENT:  Head: Normocephalic and atraumatic.  Right Ear: External ear normal.  Left Ear: External ear normal.  Nose: Nose normal.  Eyes: Conjunctivae and EOM are normal. Pupils are equal, round, and reactive to light. No scleral icterus.  Neck: Normal range of motion. Neck supple. No tracheal deviation present.  Cardiovascular: Normal rate, regular rhythm and normal heart sounds.   Pulmonary/Chest: Effort normal. No respiratory distress. She has no wheezes. She has no rales.  Abdominal: She exhibits no mass. There is no  tenderness. There is no rebound and no guarding.  Musculoskeletal: She exhibits no edema.  Lymphadenopathy:    She has no cervical adenopathy.  Neurological: She is alert and oriented to person, place, and time. Coordination normal.  Skin: Skin is warm and dry. No rash noted.  Psychiatric: She has a normal mood and affect. Her behavior is normal.      Assessment & Plan:   [name] was seen today for sore throat, cough, nasal congestion, generalized body aches and headache.  Diagnoses and all orders for this visit:  Acute bronchitis, unspecified organism  Acute pansinusitis, recurrence not specified  Other orders -     amoxicillin-clavulanate (AUGMENTIN) 875-125 MG tablet; Take 1 tablet by mouth 2 (two) times daily. -     pseudoephedrine-guaifenesin (MUCINEX D) 60-600 MG 12 hr tablet; Take 1 tablet by mouth every 12 (twelve) hours. -     chlorpheniramine-HYDROcodone (TUSSIONEX PENNKINETIC ER) 10-8 MG/5ML SUER; Take 5 mLs by mouth 2 (two) times daily.  I am having [name] start on amoxicillin-clavulanate, pseudoephedrine-guaifenesin, and chlorpheniramine-HYDROcodone. I am also having her maintain her fluticasone, levothyroxine, potassium chloride, and hydrochlorothiazide.  Meds ordered this encounter  Medications  . amoxicillin-clavulanate (AUGMENTIN) 875-125 MG tablet    Sig: Take 1 tablet by mouth 2 (two) times daily.    Dispense:  20 tablet    Refill:  0  . pseudoephedrine-guaifenesin (MUCINEX D) 60-600 MG 12 hr tablet    Sig: Take 1 tablet by mouth every 12 (twelve) hours.    Dispense:  18 tablet    Refill:  0  . chlorpheniramine-HYDROcodone (TUSSIONEX PENNKINETIC ER) 10-8 MG/5ML SUER    Sig: Take 5 mLs by mouth 2 (two) times daily.    Dispense:  60 mL    Refill:  0    Appropriate red flag conditions were discussed with the patient as well as actions that should be taken.  Patient expressed his understanding.  Follow-up: Return if symptoms worsen or fail to  improve.  [name], MD

## 2014-11-09 NOTE — Patient Instructions (Signed)

## 2014-12-10 ENCOUNTER — Other Ambulatory Visit: Payer: BLUE CROSS/BLUE SHIELD

## 2014-12-14 ENCOUNTER — Other Ambulatory Visit (INDEPENDENT_AMBULATORY_CARE_PROVIDER_SITE_OTHER): Payer: BLUE CROSS/BLUE SHIELD

## 2014-12-14 DIAGNOSIS — E890 Postprocedural hypothyroidism: Secondary | ICD-10-CM | POA: Diagnosis not present

## 2014-12-14 DIAGNOSIS — I10 Essential (primary) hypertension: Secondary | ICD-10-CM

## 2014-12-14 LAB — BASIC METABOLIC PANEL
BUN: 10 mg/dL (ref 6–23)
CALCIUM: 9.1 mg/dL (ref 8.4–10.5)
CHLORIDE: 105 meq/L (ref 96–112)
CO2: 28 meq/L (ref 19–32)
CREATININE: 0.76 mg/dL (ref 0.40–1.20)
GFR: 106.96 mL/min (ref >60.00)
GLUCOSE: 80 mg/dL (ref 70–99)
Potassium: 3.5 mEq/L (ref 3.5–5.1)
Sodium: 142 mEq/L (ref 135–145)

## 2014-12-14 LAB — TSH: TSH: 3.01 u[IU]/mL (ref 0.35–4.50)

## 2014-12-14 LAB — T4, FREE: Free T4: 0.77 ng/dL (ref 0.60–1.60)

## 2015-01-22 ENCOUNTER — Encounter: Payer: Self-pay | Admitting: Family Medicine

## 2015-01-22 ENCOUNTER — Ambulatory Visit (INDEPENDENT_AMBULATORY_CARE_PROVIDER_SITE_OTHER): Payer: BLUE CROSS/BLUE SHIELD | Admitting: Family Medicine

## 2015-01-22 VITALS — BP 140/82 | HR 61 | Temp 98.1°F | Ht 65.0 in | Wt 270.0 lb

## 2015-01-22 DIAGNOSIS — R51 Headache: Secondary | ICD-10-CM

## 2015-01-22 DIAGNOSIS — R519 Headache, unspecified: Secondary | ICD-10-CM

## 2015-01-22 MED ORDER — DICLOFENAC SODIUM 75 MG PO TBEC: 75.0000 mg | DELAYED_RELEASE_TABLET | Freq: Two times a day (BID) | ORAL | Status: DC | PRN

## 2015-01-22 NOTE — Progress Notes (Signed)
   Subjective:  Patient ID: Gloria Flynn, female    DOB: 04/15/1970  Age: 45 y.o. MRN: [id number]  CC: Headache  HPI:  45 year old female presents to clinic today with complaints of headache.  Headache  Patient reports that her headache began this morning.  She states that his frontal in location.  Pain is severe, 8/10.  She describes the pain as "nails".  No medications or interventions tried.  She reports no associated symptoms but states that she did have lightheadedness earlier today.  No associated nausea or vomiting.  No photophobia or phonophobia.  No known exacerbating or relieving factors.  Social Hx   Social History   Social History  . Marital Status: Married    Spouse Name: N/A  . Number of Children: N/A  . Years of Education: N/A   Social History Main Topics  . Smoking status: Never Smoker   . Smokeless tobacco: Never Used  . Alcohol Use: No  . Drug Use: No  . Sexual Activity:    Partners: Male    Birth Control/ Protection: None   Other Topics Concern  . None   Social History Narrative   Review of Systems  Constitutional: Negative.   Neurological: Positive for light-headedness and headaches.   Objective:  BP 140/82 mmHg  Pulse 61  Temp(Src) 98.1 F (36.7 C) (Oral)  Ht 5' 5"  (1.651 m)  Wt 270 lb (122.471 kg)  BMI 44.93 kg/m2  SpO2 98%  BP/Weight 01/22/2015 11/09/2014 2/94/7654  Systolic BP 650 354 656  Diastolic BP 82 80 82  Wt. (Lbs) 270 270 268.5  BMI 44.93 44.93 44.68    Physical Exam  Constitutional: She is oriented to person, place, and time. She appears well-developed. No distress.  HENT:  Head: Normocephalic and atraumatic.  Mouth/Throat: Oropharynx is clear and moist.  Normal TMs bilaterally.  Cardiovascular: Normal rate and regular rhythm.   Pulmonary/Chest: Effort normal and breath sounds normal.  Neurological: She is alert and oriented to person, place, and time.  PEERLA.  CN intact. Normal muscle strength  throughout.  Psychiatric: She has a normal mood and affect.  Vitals reviewed.  Lab Results  Component Value Date   WBC 4.9 09/14/2014   HGB 12.0 09/14/2014   HCT 37.6 09/14/2014   PLT 284 09/14/2014   GLUCOSE 80 12/14/2014   CHOL 158 05/09/2013   TRIG 71 05/09/2013   HDL 52 05/09/2013   LDLCALC 92 05/09/2013   ALT 17 02/16/2014   AST 22 02/16/2014   NA 142 12/14/2014   K 3.5 12/14/2014   CL 105 12/14/2014   CREATININE 0.76 12/14/2014   BUN 10 12/14/2014   CO2 28 12/14/2014   TSH 3.01 12/14/2014   HGBA1C 5.5 05/09/2013    Assessment & Plan:   Problem List Items Addressed This Visit    Frontal headache - Primary    Established problem, acute exacerbation. Unremarkable exam today. Likely tension headache. Treating with diclofenac.      Relevant Medications   diclofenac (VOLTAREN) 75 MG EC tablet      Meds ordered this encounter  Medications  . diclofenac (VOLTAREN) 75 MG EC tablet    Sig: Take 1 tablet (75 mg total) by mouth 2 (two) times daily as needed (Headache).    Dispense:  30 tablet    Refill:  0   Follow-up: PRN  [place]

## 2015-01-22 NOTE — Patient Instructions (Signed)
This appears to be tension headache.  Use the diclofenac as needed.  Follow up closely with your PCP.  Take care  Dr. Lacinda Axon

## 2015-01-22 NOTE — Assessment & Plan Note (Addendum)
Established problem, acute exacerbation. Unremarkable exam today. Likely tension headache. Treating with diclofenac.

## 2015-01-22 NOTE — Progress Notes (Signed)
Pre visit review using our clinic review tool, if applicable. No additional management support is needed unless otherwise documented below in the visit note. 

## 2015-02-14 ENCOUNTER — Ambulatory Visit (INDEPENDENT_AMBULATORY_CARE_PROVIDER_SITE_OTHER): Payer: BLUE CROSS/BLUE SHIELD | Admitting: Family Medicine

## 2015-02-14 ENCOUNTER — Encounter: Payer: Self-pay | Admitting: Family Medicine

## 2015-02-14 VITALS — BP 132/82 | HR 74 | Temp 98.0°F | Wt 274.1 lb

## 2015-02-14 DIAGNOSIS — R52 Pain, unspecified: Secondary | ICD-10-CM

## 2015-02-14 DIAGNOSIS — B349 Viral infection, unspecified: Secondary | ICD-10-CM | POA: Insufficient documentation

## 2015-02-14 LAB — POCT INFLUENZA A/B
INFLUENZA A, POC: NEGATIVE
INFLUENZA B, POC: NEGATIVE

## 2015-02-14 NOTE — Progress Notes (Signed)
Pre visit review using our clinic review tool, if applicable. No additional management support is needed unless otherwise documented below in the visit note. 

## 2015-02-14 NOTE — Progress Notes (Signed)
Patient ID: Gloria Flynn, female   DOB: October 26, 1970, 45 y.o.   MRN: [id number]  Tommi Rumps, MD Phone: [phone]  Gloria Flynn is a 45 y.o. female who presents today for same-day visit.  Patient reports onset of body aches and chills with rhinorrhea and mild cough last night. Cough is nonproductive. States she felt warm though has not checked her temperature. Mild frontal headache with this that comes and goes. Gradual in onset. No numbness, weakness, or vision changes. No shortness of breath. Vomited once yesterday. Mild nausea. No abdominal pain. Took Tylenol Cold and this did not help very much. Notes her child was sick on Tuesday with similar symptoms.  PMH: nonsmoker.   ROS see history of present illness  Objective  Physical Exam Filed Vitals:   02/14/15 1427  BP: 132/82  Pulse: 74  Temp: 98 F (36.7 C)    Physical Exam  Constitutional: She is well-developed, well-nourished, and in no distress.  HENT:  Head: Normocephalic and atraumatic.  Right Ear: External ear normal.  Left Ear: External ear normal.  Mouth/Throat: Oropharynx is clear and moist. No oropharyngeal exudate.  Normal TMs bilaterally  Eyes: Conjunctivae are normal. Pupils are equal, round, and reactive to light.  Neck: Neck supple.  Cardiovascular: Normal rate, regular rhythm and normal heart sounds.  Exam reveals no gallop and no friction rub.   No murmur heard. Pulmonary/Chest: Effort normal and breath sounds normal. No respiratory distress. She has no wheezes. She has no rales.  Abdominal: Soft. Bowel sounds are normal. She exhibits no distension. There is no tenderness. There is no rebound and no guarding.  Lymphadenopathy:    She has no cervical adenopathy.  Neurological: She is alert.  CN 2-12 intact, 5/5 strength in bilateral biceps, triceps, grip, quads, hamstrings, plantar and dorsiflexion, sensation to light touch intact in bilateral UE and LE, normal gait, 2+ patellar reflexes  Skin: Skin  is dry. She is not diaphoretic.     Assessment/Plan: Please see individual problem list.  Viral illness Patient's symptoms most consistent with viral illness. Rapid flu negative. No focal findings to indicate a bacterial illness. Her vital signs are stable. I discussed supportive care with the patient. Advised on Advil, Flonase, Claritin, and hydration. She will continue to monitor. She is given return precautions.    Orders Placed This Encounter  Procedures  . POCT Influenza A/B    Tommi Rumps

## 2015-02-14 NOTE — Patient Instructions (Signed)
Nice to meet you. Your symptoms are likely related to viral illness. You can try over-the-counter Claritin and Flonase. You can take Tylenol or ibuprofen for any fever or discomfort. If you develop shortness of breath, worsening headache, numbness, weakness, vision changes, fevers, or any new or changing symptoms please seek medical attention.

## 2015-02-14 NOTE — Assessment & Plan Note (Signed)
Patient's symptoms most consistent with viral illness. Rapid flu negative. No focal findings to indicate a bacterial illness. Her vital signs are stable. I discussed supportive care with the patient. Advised on Advil, Flonase, Claritin, and hydration. She will continue to monitor. She is given return precautions.

## 2015-03-06 ENCOUNTER — Other Ambulatory Visit: Payer: Self-pay | Admitting: Family Medicine

## 2015-03-06 ENCOUNTER — Other Ambulatory Visit: Payer: Self-pay

## 2015-03-06 ENCOUNTER — Ambulatory Visit (INDEPENDENT_AMBULATORY_CARE_PROVIDER_SITE_OTHER): Payer: BLUE CROSS/BLUE SHIELD | Admitting: Family Medicine

## 2015-03-06 VITALS — BP 128/86 | HR 96 | Temp 98.9°F | Resp 16 | Ht 65.0 in | Wt 271.0 lb

## 2015-03-06 DIAGNOSIS — J309 Allergic rhinitis, unspecified: Secondary | ICD-10-CM | POA: Diagnosis not present

## 2015-03-06 DIAGNOSIS — J069 Acute upper respiratory infection, unspecified: Secondary | ICD-10-CM

## 2015-03-06 DIAGNOSIS — B9789 Other viral agents as the cause of diseases classified elsewhere: Principal | ICD-10-CM

## 2015-03-06 MED ORDER — HYDROCOD POLST-CPM POLST ER 10-8 MG/5ML PO SUER: 5.0000 mL | Freq: Two times a day (BID) | ORAL | Status: DC

## 2015-03-06 MED ORDER — LEVOTHYROXINE SODIUM 125 MCG PO TABS: 125.0000 ug | ORAL_TABLET | Freq: Every day | ORAL | Status: DC

## 2015-03-06 MED ORDER — FLUTICASONE PROPIONATE 50 MCG/ACT NA SUSP: 2.0000 | Freq: Every day | NASAL | Status: DC

## 2015-03-06 NOTE — Progress Notes (Signed)
Subjective:    Patient ID: Gloria Flynn, female    DOB: October 08, 1970, 45 y.o.   MRN: [id number]  HPI  This is a pleasant 45 yo female who is accompanied by her son. She presents today with cough slightly productive of green/brown phlegm. Little nasal drainage, feels congested. Chills. Sore throat, light headed and dizzy for 3 days. Tylenol cold yesterday with some relief. Has some wheezing, no SOB. Son sick with similar symptoms. Has history of seasonal allergies, not currently using antihistamine. Has used flonase in the past with good results. No asthma history. Never smoked.    Past Medical History  Diagnosis Date  . Pregnancy induced hypertension   . Urinary tract infection   . Keloid   . Goiter     Dr. Shawna Orleans in [place]  . HPV in female 2009   Past Surgical History  Procedure Laterality Date  . Cesarean section    . Thyroid surgery  09/22/2013   Family History  Problem Relation Age of Onset  . Anesthesia problems Neg Hx   . Diabetes Father   . Heart disease Father    Social History  Substance Use Topics  . Smoking status: Never Smoker   . Smokeless tobacco: Never Used  . Alcohol Use: No      Review of Systems  Constitutional: Positive for chills and fatigue. Fever: unknown.  HENT: Positive for congestion, postnasal drip, rhinorrhea and sinus pressure. Negative for ear pain.   Respiratory: Positive for cough. Negative for chest tightness, shortness of breath and wheezing.   Cardiovascular: Negative for chest pain and leg swelling.  Allergic/Immunologic: Positive for environmental allergies.  Neurological: Positive for dizziness.       Objective:   Physical Exam  Constitutional: She is oriented to person, place, and time. She appears well-developed and well-nourished. No distress.  Obese   HENT:  Head: Normocephalic and atraumatic.  Right Ear: Tympanic membrane, external ear and ear canal normal.  Left Ear: Tympanic membrane, external ear and ear canal  normal.  Nose: Mucosal edema and rhinorrhea present. Right sinus exhibits no maxillary sinus tenderness and no frontal sinus tenderness. Left sinus exhibits no maxillary sinus tenderness and no frontal sinus tenderness.  Mouth/Throat: Oropharynx is clear and moist.  Eyes: Conjunctivae are normal.  Neck: Normal range of motion. Neck supple.  Cardiovascular: Normal rate, regular rhythm and normal heart sounds.   Pulmonary/Chest: Effort normal and breath sounds normal.  Musculoskeletal: Normal range of motion.  Lymphadenopathy:    She has no cervical adenopathy.  Neurological: She is alert and oriented to person, place, and time.  Skin: Skin is warm and dry. She is not diaphoretic.  Psychiatric: She has a normal mood and affect. Her behavior is normal. Judgment and thought content normal.  Vitals reviewed.      BP 128/86 mmHg  Pulse 96  Temp(Src) 98.9 F (37.2 C) (Oral)  Resp 16  Ht 5' 5"  (1.651 m)  Wt 271 lb (122.925 kg)  BMI 45.10 kg/m2  SpO2 96%  LMP 02/27/2015 Wt Readings from Last 3 Encounters:  03/06/15 271 lb (122.925 kg)  02/14/15 274 lb 2 oz (124.342 kg)  01/22/15 270 lb (122.471 kg)       Assessment & Plan:  1. Viral URI with cough -  Patient Instructions  For nasal congestion you can use Afrin nasal spray for 3 days max, Sudafed, saline nasal spray (generic is fine for all). For cough you can try Delsym. Drink enough fluids to make  your urine light yellow. For fever/chill/muscle aches you can take over the counter acetaminophen or ibuprofen.  Please come back in if you are not better in 5-7 days or if you develop wheezing, shortness of breath or persistent vomiting. - chlorpheniramine-HYDROcodone (TUSSIONEX PENNKINETIC ER) 10-8 MG/5ML SUER; Take 5 mLs by mouth 2 (two) times daily.  Dispense: 60 mL; Refill: 0  2. Allergic rhinitis, unspecified allergic rhinitis type - fluticasone (FLONASE) 50 MCG/ACT nasal spray; Place 2 sprays into both nostrils daily.   Dispense: 16 g; Refill: 6 - restart daily antihistamine  Clarene Reamer, FNP-BC  [facility]  03/08/2015 10:00 AM

## 2015-03-06 NOTE — Patient Instructions (Signed)
For nasal congestion you can use Afrin nasal spray for 3 days max, Sudafed, saline nasal spray (generic is fine for all). For cough you can try Delsym. Drink enough fluids to make your urine light yellow. For fever/chill/muscle aches you can take over the counter acetaminophen or ibuprofen.  Please come back in if you are not better in 5-7 days or if you develop wheezing, shortness of breath or persistent vomiting.  Please start your antihistamine daily

## 2015-03-06 NOTE — Telephone Encounter (Signed)
Pt left v/m requesting refill levothyroxine to [facility]. Refilled per protocol with note pt needs to call for CPX per Dr Synthia Innocent lab result note 12/15/14. Last TSH 12/2014. Pt did not request cb.

## 2015-03-08 ENCOUNTER — Encounter: Payer: Self-pay | Admitting: Family Medicine

## 2015-03-11 ENCOUNTER — Telehealth: Payer: Self-pay

## 2015-03-11 NOTE — Telephone Encounter (Signed)
Pt advised.

## 2015-03-11 NOTE — Telephone Encounter (Signed)
Ok to write?

## 2015-03-11 NOTE — Telephone Encounter (Signed)
Pt states that the out of work note that is on my chart is needing to be from march 1st thru march 8th   Best number is [phone]

## 2015-03-11 NOTE — Telephone Encounter (Signed)
That is fine 

## 2015-03-11 NOTE — Telephone Encounter (Signed)
SPoke with pt, note ready.

## 2015-03-11 NOTE — Telephone Encounter (Signed)
Pt states her stomach is still a little sweezy and she would like to have an extension from work for today and tomorrow. Please call [phone] and she pick up

## 2015-09-28 ENCOUNTER — Other Ambulatory Visit: Payer: Self-pay | Admitting: Primary Care

## 2015-09-28 DIAGNOSIS — J309 Allergic rhinitis, unspecified: Secondary | ICD-10-CM

## 2016-02-21 ENCOUNTER — Other Ambulatory Visit: Payer: Self-pay | Admitting: Family Medicine

## 2016-03-09 DIAGNOSIS — M71572 Other bursitis, not elsewhere classified, left ankle and foot: Secondary | ICD-10-CM | POA: Diagnosis not present

## 2016-03-09 DIAGNOSIS — M76821 Posterior tibial tendinitis, right leg: Secondary | ICD-10-CM | POA: Diagnosis not present

## 2016-03-09 DIAGNOSIS — M722 Plantar fascial fibromatosis: Secondary | ICD-10-CM | POA: Diagnosis not present

## 2016-03-09 DIAGNOSIS — M71571 Other bursitis, not elsewhere classified, right ankle and foot: Secondary | ICD-10-CM | POA: Diagnosis not present

## 2016-03-09 DIAGNOSIS — M7731 Calcaneal spur, right foot: Secondary | ICD-10-CM | POA: Diagnosis not present

## 2016-03-09 DIAGNOSIS — M7732 Calcaneal spur, left foot: Secondary | ICD-10-CM | POA: Diagnosis not present

## 2016-03-09 DIAGNOSIS — M19071 Primary osteoarthritis, right ankle and foot: Secondary | ICD-10-CM | POA: Diagnosis not present

## 2016-03-09 DIAGNOSIS — M659 Synovitis and tenosynovitis, unspecified: Secondary | ICD-10-CM | POA: Diagnosis not present

## 2016-03-09 DIAGNOSIS — M76822 Posterior tibial tendinitis, left leg: Secondary | ICD-10-CM | POA: Diagnosis not present

## 2016-03-16 ENCOUNTER — Institutional Professional Consult (permissible substitution): Payer: BLUE CROSS/BLUE SHIELD | Admitting: Family Medicine

## 2016-03-18 ENCOUNTER — Institutional Professional Consult (permissible substitution): Payer: Self-pay | Admitting: Family Medicine

## 2016-03-19 ENCOUNTER — Ambulatory Visit: Payer: Self-pay | Admitting: Family Medicine

## 2016-03-25 ENCOUNTER — Ambulatory Visit: Payer: Self-pay | Admitting: Family Medicine

## 2016-06-08 ENCOUNTER — Ambulatory Visit: Payer: Self-pay | Admitting: Family Medicine

## 2016-06-15 ENCOUNTER — Ambulatory Visit (INDEPENDENT_AMBULATORY_CARE_PROVIDER_SITE_OTHER): Payer: BLUE CROSS/BLUE SHIELD | Admitting: Family Medicine

## 2016-06-15 VITALS — BP 120/90 | HR 77 | Temp 98.0°F | Wt 290.0 lb

## 2016-06-15 DIAGNOSIS — E049 Nontoxic goiter, unspecified: Secondary | ICD-10-CM | POA: Diagnosis not present

## 2016-06-15 DIAGNOSIS — E890 Postprocedural hypothyroidism: Secondary | ICD-10-CM | POA: Diagnosis not present

## 2016-06-15 DIAGNOSIS — Z9889 Other specified postprocedural states: Secondary | ICD-10-CM

## 2016-06-15 DIAGNOSIS — J069 Acute upper respiratory infection, unspecified: Secondary | ICD-10-CM | POA: Diagnosis not present

## 2016-06-15 MED ORDER — FLUTICASONE PROPIONATE 50 MCG/ACT NA SUSP: 2.0000 | Freq: Every day | NASAL | 0 refills | Status: DC

## 2016-06-15 NOTE — Patient Instructions (Signed)
Great to see you. We will call you with your results from today.

## 2016-06-15 NOTE — Progress Notes (Signed)
Subjective:   Patient ID: Gloria Flynn, female    DOB: 11/24/1970, 46 y.o.   MRN: [id number]  Gloria Flynn is a pleasant 46 y.o. year old female who presents to clinic today with Fever; Chills; and Sore Throat  on 06/15/2016  HPI:  Hypothyroidism-  Overdue for labs. She has been compliant with synthroid 125 mcg daily.  Has had some fatigue and weight gain.  Wt Readings from Last 3 Encounters:  06/15/16 290 lb (131.5 kg)  03/06/15 271 lb (122.9 kg)  02/14/15 274 lb 2 oz (124.3 kg)      Lab Results  Component Value Date   TSH 3.01 12/14/2014   URI symptoms-  Fever, sore throat since Friday.   Current Outpatient Prescriptions on File Prior to Visit  Medication Sig Dispense Refill  . chlorpheniramine-HYDROcodone (TUSSIONEX PENNKINETIC ER) 10-8 MG/5ML SUER Take 5 mLs by mouth 2 (two) times daily. 60 mL 0  . diclofenac (VOLTAREN) 75 MG EC tablet Take 1 tablet (75 mg total) by mouth 2 (two) times daily as needed (Headache). 30 tablet 0  . fluticasone (FLONASE) 50 MCG/ACT nasal spray PLACE 2 SPRAYS INTO BOTH NOSTRILS DAILY. 16 g 0  . hydrochlorothiazide (MICROZIDE) 12.5 MG capsule Take 1 capsule (12.5 mg total) by mouth daily as needed. 90 capsule 2  . levothyroxine (SYNTHROID, LEVOTHROID) 125 MCG tablet TAKE 1 TABLET BY MOUTH EVERY DAY 90 tablet 2  . potassium chloride (K-DUR) 10 MEQ tablet Take 1 tablet (10 mEq total) by mouth daily as needed (with HCTZ). 30 tablet 1   No current facility-administered medications on file prior to visit.     Allergies  Allergen Reactions  . Tessalon [Benzonatate]     Dizzy, drowsy and h/a    Past Medical History:  Diagnosis Date  . Goiter    Dr. Shawna Orleans in [place]  . HPV in female 2009  . Keloid   . Pregnancy induced hypertension   . Urinary tract infection     Past Surgical History:  Procedure Laterality Date  . CESAREAN SECTION    . THYROID SURGERY  09/22/2013    Family History  Problem Relation Age of Onset  .  Anesthesia problems Neg Hx   . Diabetes Father   . Heart disease Father     Social History   Social History  . Marital status: Married    Spouse name: N/A  . Number of children: N/A  . Years of education: N/A   Occupational History  . Not on file.   Social History Main Topics  . Smoking status: Never Smoker  . Smokeless tobacco: Never Used  . Alcohol use No  . Drug use: No  . Sexual activity: Yes    Partners: Male    Birth control/ protection: None   Other Topics Concern  . Not on file   Social History Narrative  . No narrative on file   The PMH, PSH, Social History, Family History, Medications, and allergies have been reviewed in [facility], and have been updated if relevant.   Review of Systems  Constitutional: Positive for fatigue and unexpected weight change.  HENT: Positive for congestion, postnasal drip, sinus pressure and sore throat. Negative for trouble swallowing.   Respiratory: Negative.   Cardiovascular: Negative.   Gastrointestinal: Negative.   Musculoskeletal: Negative.   Allergic/Immunologic: Negative.   Neurological: Negative.   Hematological: Negative.   Psychiatric/Behavioral: Negative.   All other systems reviewed and are negative.      Objective:  BP 120/90   Pulse 77   Temp 98 F (36.7 C)   Wt 290 lb (131.5 kg)   LMP 06/12/2016   SpO2 100%   BMI 48.26 kg/m    Physical Exam  Constitutional: She appears well-developed and well-nourished. No distress.  HENT:  Head: Normocephalic and atraumatic.  Right Ear: Hearing and tympanic membrane normal.  Left Ear: Hearing and tympanic membrane normal.  Nose: Right sinus exhibits no maxillary sinus tenderness and no frontal sinus tenderness. Left sinus exhibits no frontal sinus tenderness.  Eyes: Conjunctivae are normal.  Neck:  Large keloid scar  Cardiovascular: Normal rate and regular rhythm.   Pulmonary/Chest: Effort normal and breath sounds normal.  Skin: She is not diaphoretic.    Nursing note and vitals reviewed.         Assessment & Plan:   Postoperative hypothyroidism - Plan: TSH, T4, Free, T3, Free  S/P thyroidectomy  Goiter No Follow-up on file.

## 2016-06-16 ENCOUNTER — Encounter: Payer: Self-pay | Admitting: Family Medicine

## 2016-06-16 ENCOUNTER — Other Ambulatory Visit: Payer: Self-pay | Admitting: Family Medicine

## 2016-06-16 DIAGNOSIS — J069 Acute upper respiratory infection, unspecified: Secondary | ICD-10-CM | POA: Insufficient documentation

## 2016-06-16 LAB — T3, FREE: T3, Free: 2.6 pg/mL (ref 2.3–4.2)

## 2016-06-16 LAB — TSH: TSH: 6.83 u[IU]/mL — AB (ref 0.35–4.50)

## 2016-06-16 LAB — T4, FREE: Free T4: 0.87 ng/dL (ref 0.60–1.60)

## 2016-06-16 MED ORDER — LEVOTHYROXINE SODIUM 150 MCG PO TABS: 150.0000 ug | ORAL_TABLET | Freq: Every day | ORAL | 3 refills | Status: DC

## 2016-06-16 NOTE — Assessment & Plan Note (Signed)
  Symptomatic therapy suggested: push fluids, rest and return office visit prn if symptoms persist or worsen. Lack of antibiotic effectiveness discussed with her. Call or return to clinic prn if these symptoms worsen or fail to improve as anticipated.

## 2016-06-16 NOTE — Assessment & Plan Note (Signed)
Continue synthroid. Check thyroid function today. The patient indicates understanding of these issues and agrees with the plan.

## 2016-06-28 ENCOUNTER — Encounter: Payer: Self-pay | Admitting: Family Medicine

## 2016-08-14 ENCOUNTER — Other Ambulatory Visit: Payer: BLUE CROSS/BLUE SHIELD

## 2016-08-21 ENCOUNTER — Other Ambulatory Visit: Payer: BLUE CROSS/BLUE SHIELD

## 2016-09-11 ENCOUNTER — Other Ambulatory Visit: Payer: Self-pay | Admitting: Family Medicine

## 2016-09-11 ENCOUNTER — Emergency Department (HOSPITAL_COMMUNITY): Payer: BLUE CROSS/BLUE SHIELD

## 2016-09-11 ENCOUNTER — Emergency Department (HOSPITAL_COMMUNITY)
Admission: EM | Admit: 2016-09-11 | Discharge: 2016-09-12 | Disposition: A | Discharge status: Home / Self Care | Payer: BLUE CROSS/BLUE SHIELD | Attending: Emergency Medicine | Admitting: Emergency Medicine

## 2016-09-11 ENCOUNTER — Encounter (HOSPITAL_COMMUNITY): Payer: Self-pay

## 2016-09-11 DIAGNOSIS — Z5321 Procedure and treatment not carried out due to patient leaving prior to being seen by health care provider: Secondary | ICD-10-CM | POA: Diagnosis not present

## 2016-09-11 DIAGNOSIS — R079 Chest pain, unspecified: Secondary | ICD-10-CM | POA: Diagnosis not present

## 2016-09-11 DIAGNOSIS — I10 Essential (primary) hypertension: Secondary | ICD-10-CM

## 2016-09-11 DIAGNOSIS — Z01419 Encounter for gynecological examination (general) (routine) without abnormal findings: Secondary | ICD-10-CM

## 2016-09-11 DIAGNOSIS — E890 Postprocedural hypothyroidism: Secondary | ICD-10-CM

## 2016-09-11 LAB — BASIC METABOLIC PANEL
ANION GAP: 11 (ref 5–15)
BUN: 10 mg/dL (ref 6–20)
CALCIUM: 8.8 mg/dL — AB (ref 8.9–10.3)
CHLORIDE: 101 mmol/L (ref 101–111)
CO2: 24 mmol/L (ref 22–32)
Creatinine, Ser: 0.92 mg/dL (ref 0.44–1.00)
GFR calc non Af Amer: >60 mL/min (ref >60)
Glucose, Bld: 92 mg/dL (ref 65–99)
Potassium: 3.2 mmol/L — ABNORMAL LOW (ref 3.5–5.1)
SODIUM: 136 mmol/L (ref 135–145)

## 2016-09-11 LAB — CBC
HCT: 35.6 % — ABNORMAL LOW (ref 36.0–46.0)
HEMOGLOBIN: 11.6 g/dL — AB (ref 12.0–15.0)
MCH: 25.6 pg — ABNORMAL LOW (ref 26.0–34.0)
MCHC: 32.6 g/dL (ref 30.0–36.0)
MCV: 78.6 fL (ref 78.0–100.0)
Platelets: 315 10*3/uL (ref 150–400)
RBC: 4.53 MIL/uL (ref 3.87–5.11)
RDW: 15.4 % (ref 11.5–15.5)
WBC: 5.7 10*3/uL (ref 4.0–10.5)

## 2016-09-11 LAB — POCT I-STAT TROPONIN I: Troponin i, poc: 0 ng/mL (ref 0.00–0.08)

## 2016-09-11 NOTE — ED Triage Notes (Signed)
Pt complains of chest pain that started a week ago after having dental work Pt denies SOB or nausea

## 2016-09-12 NOTE — ED Triage Notes (Signed)
Pt called from triage with no answer

## 2016-09-18 ENCOUNTER — Other Ambulatory Visit: Payer: BLUE CROSS/BLUE SHIELD

## 2016-09-25 ENCOUNTER — Other Ambulatory Visit: Payer: BLUE CROSS/BLUE SHIELD

## 2016-10-02 ENCOUNTER — Other Ambulatory Visit: Payer: BLUE CROSS/BLUE SHIELD

## 2016-10-07 ENCOUNTER — Other Ambulatory Visit (INDEPENDENT_AMBULATORY_CARE_PROVIDER_SITE_OTHER): Payer: BLUE CROSS/BLUE SHIELD

## 2016-10-07 DIAGNOSIS — Z01419 Encounter for gynecological examination (general) (routine) without abnormal findings: Secondary | ICD-10-CM

## 2016-10-07 DIAGNOSIS — E890 Postprocedural hypothyroidism: Secondary | ICD-10-CM

## 2016-10-08 LAB — LIPID PANEL
CHOL/HDL RATIO: 4
Cholesterol: 175 mg/dL (ref 0–200)
HDL: 44.9 mg/dL (ref >39.00)
LDL Cholesterol: 111 mg/dL — ABNORMAL HIGH (ref 0–99)
NONHDL: 130.17
Triglycerides: 97 mg/dL (ref 0.0–149.0)
VLDL: 19.4 mg/dL (ref 0.0–40.0)

## 2016-10-08 LAB — CBC WITH DIFFERENTIAL/PLATELET
BASOS ABS: 0.1 10*3/uL (ref 0.0–0.1)
Basophils Relative: 1.4 % (ref 0.0–3.0)
EOS ABS: 0.1 10*3/uL (ref 0.0–0.7)
Eosinophils Relative: 1.3 % (ref 0.0–5.0)
HCT: 35.1 % — ABNORMAL LOW (ref 36.0–46.0)
Hemoglobin: 11.2 g/dL — ABNORMAL LOW (ref 12.0–15.0)
LYMPHS ABS: 2.2 10*3/uL (ref 0.7–4.0)
Lymphocytes Relative: 44.6 % (ref 12.0–46.0)
MCHC: 32 g/dL (ref 30.0–36.0)
MCV: 80 fl (ref 78.0–100.0)
MONO ABS: 0.5 10*3/uL (ref 0.1–1.0)
Monocytes Relative: 9.7 % (ref 3.0–12.0)
NEUTROS ABS: 2.1 10*3/uL (ref 1.4–7.7)
NEUTROS PCT: 43 % (ref 43.0–77.0)
Platelets: 322 10*3/uL (ref 150.0–400.0)
RBC: 4.38 Mil/uL (ref 3.87–5.11)
RDW: 15.8 % — ABNORMAL HIGH (ref 11.5–15.5)
WBC: 4.9 10*3/uL (ref 4.0–10.5)

## 2016-10-08 LAB — TSH: TSH: 7.89 u[IU]/mL — AB (ref 0.35–4.50)

## 2016-10-08 LAB — COMPREHENSIVE METABOLIC PANEL
ALK PHOS: 54 U/L (ref 39–117)
ALT: 10 U/L (ref 0–35)
AST: 13 U/L (ref 0–37)
Albumin: 3.9 g/dL (ref 3.5–5.2)
BILIRUBIN TOTAL: 0.4 mg/dL (ref 0.2–1.2)
BUN: 8 mg/dL (ref 6–23)
CO2: 28 meq/L (ref 19–32)
CREATININE: 0.82 mg/dL (ref 0.40–1.20)
Calcium: 8.9 mg/dL (ref 8.4–10.5)
Chloride: 103 mEq/L (ref 96–112)
GFR: 97.16 mL/min (ref >60.00)
GLUCOSE: 79 mg/dL (ref 70–99)
Potassium: 3.4 mEq/L — ABNORMAL LOW (ref 3.5–5.1)
Sodium: 137 mEq/L (ref 135–145)
TOTAL PROTEIN: 7.4 g/dL (ref 6.0–8.3)

## 2016-10-08 LAB — T4, FREE: FREE T4: 0.86 ng/dL (ref 0.60–1.60)

## 2016-10-10 ENCOUNTER — Encounter: Payer: Self-pay | Admitting: Family Medicine

## 2016-10-26 ENCOUNTER — Encounter: Payer: Self-pay | Admitting: Family Medicine

## 2016-10-26 ENCOUNTER — Ambulatory Visit (INDEPENDENT_AMBULATORY_CARE_PROVIDER_SITE_OTHER): Payer: BLUE CROSS/BLUE SHIELD | Admitting: Family Medicine

## 2016-10-26 VITALS — BP 150/102 | HR 94 | Temp 98.2°F | Ht 65.0 in | Wt 300.4 lb

## 2016-10-26 DIAGNOSIS — E890 Postprocedural hypothyroidism: Secondary | ICD-10-CM | POA: Diagnosis not present

## 2016-10-26 MED ORDER — LEVOTHYROXINE SODIUM 175 MCG PO TABS: 175.0000 ug | ORAL_TABLET | Freq: Every day | ORAL | 3 refills | Status: DC

## 2016-10-26 NOTE — Patient Instructions (Addendum)
Great to see you. Please STOP taking synthroid 150 mcg daily and start taking synthroid 175 mcg daily.  Return in 4 weeks for labs.  Please restart your blood pressure medication.

## 2016-10-26 NOTE — Progress Notes (Signed)
Subjective:   Patient ID: Gloria Flynn, female    DOB: 06-Feb-1970, 46 y.o.   MRN: [id number]  Gloria Flynn is a pleasant 46 y.o. year old female who presents to clinic today with Follow-up (Patient is here today to F/U from labs drawn on 10.03.2018.  She states that she is tired all the time.  )  on 10/26/2016  HPI:  Post operative hypothyroidism-  Has been compliant with synthroid 150 mcg daily.  TSH was high this month (low normal T4) and she is feeling quite tired. Lab Results  Component Value Date   TSH 7.89 (H) 10/07/2016      Current Outpatient Prescriptions on File Prior to Visit  Medication Sig Dispense Refill  . hydrochlorothiazide (MICROZIDE) 12.5 MG capsule Take 1 capsule (12.5 mg total) by mouth daily as needed. (Patient not taking: Reported on 10/26/2016) 90 capsule 2  . potassium chloride (K-DUR) 10 MEQ tablet Take 1 tablet (10 mEq total) by mouth daily as needed (with HCTZ). (Patient not taking: Reported on 10/26/2016) 30 tablet 1   No current facility-administered medications on file prior to visit.     Allergies  Allergen Reactions  . Tessalon [Benzonatate]     Dizzy, drowsy and h/a    Past Medical History:  Diagnosis Date  . Goiter    Dr. Shawna Orleans in [place]  . HPV in female 2009  . Keloid   . Pregnancy induced hypertension   . Urinary tract infection     Past Surgical History:  Procedure Laterality Date  . CESAREAN SECTION    . THYROID SURGERY  09/22/2013    Family History  Problem Relation Age of Onset  . Diabetes Father   . Heart disease Father   . Anesthesia problems Neg Hx     Social History   Social History  . Marital status: Married    Spouse name: N/A  . Number of children: N/A  . Years of education: N/A   Occupational History  . Not on file.   Social History Main Topics  . Smoking status: Never Smoker  . Smokeless tobacco: Never Used  . Alcohol use No  . Drug use: No  . Sexual activity: Yes    Partners: Male   Birth control/ protection: None   Other Topics Concern  . Not on file   Social History Narrative  . No narrative on file   The PMH, PSH, Social History, Family History, Medications, and allergies have been reviewed in [facility], and have been updated if relevant.  Review of Systems  Constitutional: Positive for fatigue.  HENT: Negative.   Respiratory: Negative.   Cardiovascular: Negative.   Gastrointestinal: Negative.   Endocrine: Negative.   Genitourinary: Negative.   Musculoskeletal: Negative.   Neurological: Negative.   Hematological: Negative.   Psychiatric/Behavioral: Negative.   All other systems reviewed and are negative.      Objective:    BP (!) 150/102 (BP Location: Left Arm, Patient Position: Sitting, Cuff Size: Large)   Pulse 94   Temp 98.2 F (36.8 C) (Oral)   Ht 5' 5"  (1.651 m)   Wt (!) 300 lb 6.4 oz (136.3 kg)   LMP 10/25/2016   SpO2 97%   BMI 49.99 kg/m   BP Readings from Last 3 Encounters:  10/26/16 (!) 150/102  09/11/16 (!) 157/100  06/15/16 120/90    Physical Exam  Constitutional: She is oriented to person, place, and time. She appears well-developed and well-nourished. No distress.  HENT:  Head:  Normocephalic and atraumatic.  Eyes: Conjunctivae are normal.  Cardiovascular: Normal rate and regular rhythm.   Pulmonary/Chest: Effort normal and breath sounds normal.  Neurological: She is alert and oriented to person, place, and time. No cranial nerve deficit.  Skin: Skin is warm and dry. She is not diaphoretic.  Psychiatric: She has a normal mood and affect. Her behavior is normal. Judgment and thought content normal.  Nursing note and vitals reviewed.         Assessment & Plan:   Postoperative hypothyroidism - Plan: TSH, T4, Free No Follow-up on file.

## 2016-10-27 ENCOUNTER — Other Ambulatory Visit: Payer: Self-pay | Admitting: Family Medicine

## 2016-10-27 MED ORDER — POTASSIUM CHLORIDE ER 10 MEQ PO TBCR: 10.0000 meq | EXTENDED_RELEASE_TABLET | Freq: Every day | ORAL | 1 refills | Status: DC | PRN

## 2016-10-27 MED ORDER — HYDROCHLOROTHIAZIDE 12.5 MG PO CAPS: 12.5000 mg | ORAL_CAPSULE | Freq: Every day | ORAL | 2 refills | Status: DC | PRN

## 2016-10-27 NOTE — Assessment & Plan Note (Signed)
Labs are a little off and she is still feeling tired. Will try higher dose of synthroid. Repeat labs in 4- 8 weeks. The patient indicates understanding of these issues and agrees with the plan.

## 2016-10-27 NOTE — Telephone Encounter (Signed)
Pt last seen 10/26/16;Dr Deborra Medina said to restart BP med; is that HCTZ and do you want pt on K? Kdur 10 meq # 30 x 1 on 09/16/14. And HCTZ 12.5 mg last refilled # 90 x 2 on 10/08/2014.Please advise.

## 2016-10-27 NOTE — Telephone Encounter (Signed)
Copied from [place] #[id number]. Topic: Quick Communication - See Telephone Encounter >> Oct 27, 2016  8:11 AM [name] wrote: CRM for notification. See Telephone encounter for:  10/27/16.  Needing blood pressure meds and potassium refilled.

## 2016-10-27 NOTE — Telephone Encounter (Signed)
Copied from [place] #[id number]. Topic: Quick Communication - See Telephone Encounter >> Oct 27, 2016  8:11 AM [name] wrote: CRM for notification. See Telephone encounter for:  10/27/16.  Needing blood pressure meds and potassium refilled.

## 2016-10-27 NOTE — Telephone Encounter (Signed)
Copied from [place] #[id number]. Topic: Quick Communication - See Telephone Encounter >> Oct 27, 2016  8:11 AM [name] wrote: CRM for notification. See Telephone encounter for:  10/27/16.  Needing blood pressure meds and potassium refilled.

## 2016-10-27 NOTE — Telephone Encounter (Signed)
Copied from [place] #[id number]. Topic: Quick Communication - See Telephone Encounter >> Oct 27, 2016  8:11 AM [name] wrote: CRM for notification. See Telephone encounter for:  10/27/16.  Needing blood pressure meds and potassium refilled.

## 2016-10-29 ENCOUNTER — Ambulatory Visit: Payer: Self-pay | Admitting: *Deleted

## 2016-10-29 ENCOUNTER — Telehealth: Payer: Self-pay | Admitting: Family Medicine

## 2016-10-29 NOTE — Telephone Encounter (Signed)
Advised her that vitamins don't give her energy but she could take a B Complex or just the B12 by itself.   She said she was just tired from working too many hours and not getting enough sleep.  Acknowledged she would try the B Complex which I advised could be bought over the counter.

## 2016-10-29 NOTE — Telephone Encounter (Signed)
Copied from [place] #[id number]. Topic: Quick Communication - See Telephone Encounter >> Oct 29, 2016 11:35 AM [name] wrote: CRM for notification. See Telephone encounter for:  10/29/16.  Pt wants to know about taking vitamins and if she can take a B12 for energy

## 2016-11-23 ENCOUNTER — Telehealth: Payer: Self-pay

## 2016-11-23 NOTE — Telephone Encounter (Signed)
The pt is scheduled for a Lab Visit on Wed Nov 21 at 350 with a note stating she will be here before 5pm. The latest she can be is 430. We close at [place] created

## 2016-11-23 NOTE — Telephone Encounter (Signed)
Pt scheduled for 4pm.

## 2016-11-25 ENCOUNTER — Other Ambulatory Visit: Payer: BLUE CROSS/BLUE SHIELD

## 2016-12-02 ENCOUNTER — Other Ambulatory Visit: Payer: BLUE CROSS/BLUE SHIELD

## 2016-12-09 ENCOUNTER — Other Ambulatory Visit: Payer: BLUE CROSS/BLUE SHIELD

## 2016-12-09 ENCOUNTER — Other Ambulatory Visit (INDEPENDENT_AMBULATORY_CARE_PROVIDER_SITE_OTHER): Payer: BLUE CROSS/BLUE SHIELD

## 2016-12-09 DIAGNOSIS — E890 Postprocedural hypothyroidism: Secondary | ICD-10-CM | POA: Diagnosis not present

## 2016-12-09 LAB — TSH: TSH: 0.1 u[IU]/mL — ABNORMAL LOW (ref 0.35–4.50)

## 2016-12-09 LAB — T4, FREE: Free T4: 1.17 ng/dL (ref 0.60–1.60)

## 2016-12-10 ENCOUNTER — Telehealth: Payer: Self-pay | Admitting: Family Medicine

## 2016-12-10 NOTE — Telephone Encounter (Signed)
Copied from [place] [id number]. Topic: Quick Communication - See Telephone Encounter >> Dec 10, 2016 10:42 AM Arletha Grippe wrote: CRM for notification. See Telephone encounter for:   Pt called about thyroid - her levels when taking the 175 dose.  She saw her results on mychart.  Can you lower dose so that she is in normal range? [facility]ses [facility] [address]. [phone] for pt.

## 2016-12-11 ENCOUNTER — Other Ambulatory Visit: Payer: Self-pay | Admitting: Nurse Practitioner

## 2016-12-11 DIAGNOSIS — E890 Postprocedural hypothyroidism: Secondary | ICD-10-CM

## 2016-12-11 MED ORDER — LEVOTHYROXINE SODIUM 150 MCG PO TABS: 150.0000 ug | ORAL_TABLET | Freq: Every day | ORAL | 1 refills | Status: DC

## 2016-12-11 NOTE — Telephone Encounter (Signed)
Ok to decrease medication dose. New rx sent. Needs to f/up with Dr. Deborra Medina in 6weeks

## 2016-12-11 NOTE — Telephone Encounter (Signed)
TA-Pt saw her TSH which is 0.10 and she is on 157mg/she is asking for this to be adjusted so that it can normalize/also if you want to change the dosage when would you want her to recheck the levels? I can put in a future order if you wish/plz advise on both dosage and future labs/thx dmf

## 2016-12-11 NOTE — Telephone Encounter (Signed)
Called pt to see how she was feeling, no answer. Left message to give the office a call back.

## 2016-12-11 NOTE — Telephone Encounter (Signed)
Pt returned call, advised of message below from provider. Pt says that she still have some 150 MCG from before (medication dose change) she will take those and pick up at pharmacy if needed. Pt's apt has been scheduled with PCP in 6 weeks. Pt will call office if needed sooner.    Thanks.

## 2016-12-11 NOTE — Telephone Encounter (Signed)
Noted/thx dmf

## 2016-12-18 ENCOUNTER — Telehealth: Payer: Self-pay | Admitting: Family Medicine

## 2016-12-18 NOTE — Telephone Encounter (Signed)
Copied from [place]. Topic: Quick Communication - See Telephone Encounter >> Dec 18, 2016  4:07 PM Bea Graff, NT wrote: CRM for notification. See Telephone encounter for: Pt calling to get lab results.   12/18/16.

## 2016-12-18 NOTE — Telephone Encounter (Signed)
TA-Pt called back to advise after receiving VM about labs/states that she just started taking the 175mg thyroid med from the 1734m and says that she can't tell much difference/I told her that after taking it for a week or so to call and let usKoreanow how she's feeling and she agreed/plz advise if you would like for me to tell her anything else/thx dmf

## 2016-12-19 NOTE — Telephone Encounter (Signed)
Agree with your advice.  Thank you.

## 2016-12-22 ENCOUNTER — Other Ambulatory Visit: Payer: Self-pay | Admitting: Family Medicine

## 2017-02-01 ENCOUNTER — Ambulatory Visit: Payer: BLUE CROSS/BLUE SHIELD | Admitting: Family Medicine

## 2017-02-08 ENCOUNTER — Ambulatory Visit: Payer: BLUE CROSS/BLUE SHIELD | Admitting: Family Medicine

## 2017-02-09 DIAGNOSIS — J069 Acute upper respiratory infection, unspecified: Secondary | ICD-10-CM | POA: Diagnosis not present

## 2017-03-08 ENCOUNTER — Encounter: Payer: Self-pay | Admitting: Family Medicine

## 2017-03-22 ENCOUNTER — Ambulatory Visit: Payer: BLUE CROSS/BLUE SHIELD | Admitting: Family Medicine

## 2017-04-13 ENCOUNTER — Other Ambulatory Visit: Payer: Self-pay | Admitting: Family Medicine

## 2017-04-26 ENCOUNTER — Encounter: Payer: Self-pay | Admitting: Family Medicine

## 2017-04-26 ENCOUNTER — Ambulatory Visit (INDEPENDENT_AMBULATORY_CARE_PROVIDER_SITE_OTHER): Payer: BLUE CROSS/BLUE SHIELD | Admitting: Family Medicine

## 2017-04-26 VITALS — BP 118/80 | HR 85 | Temp 97.9°F | Ht 65.0 in | Wt 297.2 lb

## 2017-04-26 DIAGNOSIS — Z23 Encounter for immunization: Secondary | ICD-10-CM

## 2017-04-26 DIAGNOSIS — Z9889 Other specified postprocedural states: Secondary | ICD-10-CM | POA: Diagnosis not present

## 2017-04-26 DIAGNOSIS — E890 Postprocedural hypothyroidism: Secondary | ICD-10-CM | POA: Diagnosis not present

## 2017-04-26 MED ORDER — PHENTERMINE HCL 15 MG PO CAPS
15.0000 mg | ORAL_CAPSULE | ORAL | 1 refills | Status: DC
Start: 2017-04-26 — End: 2017-09-13

## 2017-04-26 NOTE — Assessment & Plan Note (Signed)
Discussed weight loss plan.  Pt would also like to discuss medication options- discussed phentermine risk benefits, side effects including HTN, pulmonary HTN, stroke.    She would like to start phentermine and lifestyle changes.  Follow up in 1 month.  If BMI < 27 will decrease to half dose x 1 month then stop

## 2017-04-26 NOTE — Progress Notes (Signed)
Subjective:   Patient ID: Gloria Flynn, female    DOB: 1970-08-17, 47 y.o.   MRN: [id number]  Gloria Flynn is a pleasant 47 y.o. year old female who presents to clinic today with Postoperative Hypothyroidism (Patient is here today to F/U with thyroid.  She was changed from Levothyroxine 150mg 1qd to 151m 1qd due to her labs from 12.5.18.  She states that she cannot tell much difference.)  on 04/26/2017  HPI:  Post operative hypothyroidism- synthroid dose decreased to 150 mcg from 175 mcg after her TSH was low and she was symptomatic with fatigue and difficulty losing weight in 12/2016.  Since making that change she "cannot tell much difference."  Still cannot seem to lose weight despite changing her diet and increasing exercise.  Wt Readings from Last 3 Encounters:  04/26/17 297 lb 3.2 oz (134.8 kg)  10/26/16 (!) 300 lb 6.4 oz (136.3 kg)  06/15/16 290 lb (131.5 kg)     Current Outpatient Medications on File Prior to Visit  Medication Sig Dispense Refill  . hydrochlorothiazide (MICROZIDE) 12.5 MG capsule Take 1 capsule (12.5 mg total) by mouth daily as needed. 90 capsule 2  . KLOR-CON M10 10 MEQ tablet TAKE 1 TABLET BY MOUTH DAILY AS NEEDED WITH HCTZ 30 tablet 0  . levothyroxine (SYNTHROID, LEVOTHROID) 150 MCG tablet Take 1 tablet (150 mcg total) by mouth daily before breakfast. 30 tablet 1   No current facility-administered medications on file prior to visit.     Allergies  Allergen Reactions  . Tessalon [Benzonatate]     Dizzy, drowsy and h/a    Past Medical History:  Diagnosis Date  . Goiter    Dr. YoShawna Orleansn ChSloan. HPV in female 2009  . Keloid   . Pregnancy induced hypertension   . Urinary tract infection     Past Surgical History:  Procedure Laterality Date  . CESAREAN SECTION    . THYROID SURGERY  09/22/2013    Family History  Problem Relation Age of Onset  . Diabetes Father   . Heart disease Father   . Anesthesia problems Neg Hx     Social  History   Socioeconomic History  . Marital status: Married    Spouse name: Not on file  . Number of children: Not on file  . Years of education: Not on file  . Highest education level: Not on file  Occupational History  . Not on file  Social Needs  . Financial resource strain: Not on file  . Food insecurity:    Worry: Not on file    Inability: Not on file  . Transportation needs:    Medical: Not on file    Non-medical: Not on file  Tobacco Use  . Smoking status: Never Smoker  . Smokeless tobacco: Never Used  Substance and Sexual Activity  . Alcohol use: No  . Drug use: No  . Sexual activity: Yes    Partners: Male    Birth control/protection: None  Lifestyle  . Physical activity:    Days per week: Not on file    Minutes per session: Not on file  . Stress: Not on file  Relationships  . Social connections:    Talks on phone: Not on file    Gets together: Not on file    Attends religious service: Not on file    Active member of club or organization: Not on file    Attends meetings of clubs or organizations: Not on file  Relationship status: Not on file  . Intimate partner violence:    Fear of current or ex partner: Not on file    Emotionally abused: Not on file    Physically abused: Not on file    Forced sexual activity: Not on file  Other Topics Concern  . Not on file  Social History Narrative  . Not on file   The PMH, PSH, Social History, Family History, Medications, and allergies have been reviewed in [facility], and have been updated if relevant.   Review of Systems  Constitutional: Positive for fatigue.  HENT: Negative.   Respiratory: Negative.   Cardiovascular: Negative.   Allergic/Immunologic: Negative.   Neurological: Negative.   Hematological: Negative.   All other systems reviewed and are negative.      Objective:    BP 118/80 (BP Location: Left Arm, Patient Position: Sitting, Cuff Size: Large)   Pulse 85   Temp 97.9 F (36.6 C) (Oral)   Ht 5'  5" (1.651 m)   Wt 297 lb 3.2 oz (134.8 kg)   LMP 04/24/2017   SpO2 98%   BMI 49.46 kg/m    Physical Exam  Constitutional: She is oriented to person, place, and time. She appears well-developed and well-nourished. No distress.  HENT:  Head: Normocephalic.  Eyes: EOM are normal.  Cardiovascular: Normal rate.  Pulmonary/Chest: Effort normal and breath sounds normal.  Musculoskeletal: Normal range of motion.  Neurological: She is alert and oriented to person, place, and time. She displays normal reflexes. No cranial nerve deficit. Coordination normal.  Skin: Skin is warm. She is not diaphoretic.  Psychiatric: She has a normal mood and affect. Her behavior is normal. Judgment and thought content normal.  Nursing note and vitals reviewed.         Assessment & Plan:   Need for hepatitis B vaccination - Plan: Hepatitis B vaccine adult IM  Need for Tdap vaccination - Plan: Tdap vaccine greater than or equal to 7yo IM  Postoperative hypothyroidism No follow-ups on file.

## 2017-04-26 NOTE — Patient Instructions (Addendum)
Great to see you. I will call you with your lab results from today and you can view them online.   We are starting phentermine 15 mg daily- please come see me in one month.

## 2017-04-26 NOTE — Assessment & Plan Note (Signed)
S/p thyroidectomy. Repeat labs today- change made to dose of synthroid in 12/2016.  The patient indicates understanding of these issues and agrees with the plan.  Orders Placed This Encounter  Procedures  . Hepatitis B vaccine adult IM  . Tdap vaccine greater than or equal to 45yo IM  . TSH  . T4, free  . T3

## 2017-04-27 LAB — T4, FREE: Free T4: 1.02 ng/dL (ref 0.60–1.60)

## 2017-04-27 LAB — TSH: TSH: 2.59 u[IU]/mL (ref 0.35–4.50)

## 2017-04-27 LAB — T3: T3, Total: 120 ng/dL (ref 76–181)

## 2017-05-12 ENCOUNTER — Other Ambulatory Visit: Payer: Self-pay | Admitting: Family Medicine

## 2017-05-12 ENCOUNTER — Encounter: Payer: Self-pay | Admitting: Family Medicine

## 2017-05-12 DIAGNOSIS — E890 Postprocedural hypothyroidism: Secondary | ICD-10-CM

## 2017-05-12 MED ORDER — LEVOTHYROXINE SODIUM 150 MCG PO TABS: 150.0000 ug | ORAL_TABLET | Freq: Every day | ORAL | 2 refills | Status: DC

## 2017-06-03 ENCOUNTER — Other Ambulatory Visit: Payer: Self-pay | Admitting: Family Medicine

## 2017-06-03 DIAGNOSIS — L910 Hypertrophic scar: Secondary | ICD-10-CM | POA: Diagnosis not present

## 2017-06-03 DIAGNOSIS — E890 Postprocedural hypothyroidism: Secondary | ICD-10-CM

## 2017-06-23 ENCOUNTER — Ambulatory Visit: Payer: BLUE CROSS/BLUE SHIELD | Admitting: Family Medicine

## 2017-08-20 ENCOUNTER — Encounter: Payer: Self-pay | Admitting: Nurse Practitioner

## 2017-08-20 ENCOUNTER — Ambulatory Visit (INDEPENDENT_AMBULATORY_CARE_PROVIDER_SITE_OTHER): Payer: BLUE CROSS/BLUE SHIELD | Admitting: Nurse Practitioner

## 2017-08-20 ENCOUNTER — Other Ambulatory Visit (HOSPITAL_COMMUNITY)
Admission: RE | Admit: 2017-08-20 | Discharge: 2017-08-20 | Disposition: A | Discharge status: Home / Self Care | Payer: BLUE CROSS/BLUE SHIELD | Source: Ambulatory Visit | Attending: Nurse Practitioner | Admitting: Nurse Practitioner

## 2017-08-20 VITALS — BP 116/80 | HR 69 | Temp 98.0°F | Ht 65.0 in | Wt 260.0 lb

## 2017-08-20 DIAGNOSIS — R350 Frequency of micturition: Secondary | ICD-10-CM | POA: Diagnosis not present

## 2017-08-20 DIAGNOSIS — A599 Trichomoniasis, unspecified: Secondary | ICD-10-CM

## 2017-08-20 DIAGNOSIS — R102 Pelvic and perineal pain: Secondary | ICD-10-CM | POA: Diagnosis not present

## 2017-08-20 DIAGNOSIS — N898 Other specified noninflammatory disorders of vagina: Secondary | ICD-10-CM

## 2017-08-20 LAB — POCT URINALYSIS DIPSTICK
BILIRUBIN UA: NEGATIVE
Blood, UA: NEGATIVE
GLUCOSE UA: NEGATIVE
Ketones, UA: NEGATIVE
Leukocytes, UA: NEGATIVE
Nitrite, UA: NEGATIVE
Protein, UA: NEGATIVE
Urobilinogen, UA: 0.2 E.U./dL
pH, UA: 6 (ref 5.0–8.0)

## 2017-08-20 LAB — POCT URINE PREGNANCY: Preg Test, Ur: NEGATIVE

## 2017-08-20 MED ORDER — METRONIDAZOLE 0.75 % VA GEL
1.0000 | Freq: Every day | VAGINAL | 0 refills | Status: DC
Start: 2017-08-20 — End: 2017-09-13

## 2017-08-20 NOTE — Patient Instructions (Addendum)
UA and urine culture negative for UTI. Urine pregnancy is negative. Vaginal swab sent You will be contacted with results.  Push oral hydration.

## 2017-08-20 NOTE — Progress Notes (Addendum)
Subjective:  Patient ID: Gloria Flynn, female    DOB: [birth year]  Age: 47 y.o. MRN: [id number]  CC: Urinary Tract Infection (patient is complaining of requent urinate,odor (new) and abd pain. this has been going on for 2 mo. took advile for pain. )   Abdominal Pain  This is a new problem. The current episode started more than 1 month ago. The onset quality is gradual. The problem occurs intermittently. The problem has been waxing and waning. The pain is located in the suprapubic region. The quality of the pain is colicky, dull and a sensation of fullness. Pain radiation: lower back. Associated symptoms include frequency. Pertinent negatives include no anorexia, constipation, diarrhea, dysuria, fever, hematuria, melena or nausea. Associated symptoms comments: Vaginal discharge and odor. The pain is relieved by certain positions. She has tried acetaminophen for the symptoms. The treatment provided mild relief.  vaginal discharge and odor more promonent closer to menstrual cycle. Admits to douching after intercourse. Unprotected intercourse.  Reviewed past Medical, Social and Family history today.  Outpatient Medications Prior to Visit  Medication Sig Dispense Refill  . levothyroxine (SYNTHROID, LEVOTHROID) 150 MCG tablet TAKE 1 TABLET (150 MCG TOTAL) BY MOUTH DAILY BEFORE BREAKFAST. 90 tablet 1  . KLOR-CON M10 10 MEQ tablet TAKE 1 TABLET BY MOUTH DAILY AS NEEDED WITH HCTZ (Patient not taking: Reported on 08/20/2017) 30 tablet 0  . phentermine 15 MG capsule Take 1 capsule (15 mg total) by mouth every morning. (Patient not taking: Reported on 08/20/2017) 30 capsule 1   No facility-administered medications prior to visit.     ROS See HPI  Objective:  BP 116/80   Pulse 69   Temp 98 F (36.7 C) (Oral)   Ht 5' 5"  (1.651 m)   Wt 260 lb (117.9 kg)   LMP 07/27/2017 (Within Days)   SpO2 98%   BMI 43.27 kg/m   BP Readings from Last 3 Encounters:  08/20/17 116/80  04/26/17 118/80    10/26/16 (!) 150/102    Wt Readings from Last 3 Encounters:  08/20/17 260 lb (117.9 kg)  04/26/17 297 lb 3.2 oz (134.8 kg)  10/26/16 (!) 300 lb 6.4 oz (136.3 kg)    Physical Exam  Constitutional: She is oriented to person, place, and time.  Abdominal: Soft. Bowel sounds are normal. She exhibits no distension. There is tenderness. There is no guarding.  No CVA tenderness  Genitourinary:  Genitourinary Comments: Declined pelvic exam. Opted to self swab  Neurological: She is alert and oriented to person, place, and time.  Vitals reviewed.   Lab Results  Component Value Date   WBC 4.9 10/07/2016   HGB 11.2 (L) 10/07/2016   HCT 35.1 (L) 10/07/2016   PLT 322.0 10/07/2016   GLUCOSE 79 10/07/2016   CHOL 175 10/07/2016   TRIG 97.0 10/07/2016   HDL 44.90 10/07/2016   LDLCALC 111 (H) 10/07/2016   ALT 10 10/07/2016   AST 13 10/07/2016   NA 137 10/07/2016   K 3.4 (L) 10/07/2016   CL 103 10/07/2016   CREATININE 0.82 10/07/2016   BUN 8 10/07/2016   CO2 28 10/07/2016   TSH 2.59 04/26/2017   HGBA1C 5.5 05/09/2013    Mm Digital Screening Bilateral  Result Date: 06/01/2013 CLINICAL DATA:  Screening. EXAM: DIGITAL SCREENING BILATERAL MAMMOGRAM WITH CAD COMPARISON:  None. ACR Breast Density Category b: There are scattered areas of fibroglandular density. FINDINGS: There are no findings suspicious for malignancy. Images were processed with CAD. IMPRESSION: No mammographic evidence of  malignancy. A result letter of this screening mammogram will be mailed directly to the patient. RECOMMENDATION: Screening mammogram in one year. (Code:SM-B-01Y) BI-RADS CATEGORY  1: Negative. Electronically Signed   By: Skipper Cliche M.D.   On: 06/02/2013 09:40    Assessment & Plan:   Zain was seen today for urinary tract infection.  Diagnoses and all orders for this visit:  Frequent urination -     POCT urinalysis dipstick -     Urine Culture  Suprapubic abdominal pain -     POCT urine  pregnancy -     Cervicovaginal ancillary only  Trichomoniasis -     Cervicovaginal ancillary only -     metroNIDAZOLE (METROGEL) 0.75 % vaginal gel; Place 1 Applicatorful vaginally at bedtime. 7days -     metroNIDAZOLE (FLAGYL) 500 MG tablet; Take 4 tablets (2,000 mg total) by mouth once for 1 dose.   I am having Wei Biscardi start on metroNIDAZOLE and metroNIDAZOLE. I am also having her maintain her KLOR-CON M10, phentermine, and levothyroxine.  Meds ordered this encounter  Medications  . metroNIDAZOLE (METROGEL) 0.75 % vaginal gel    Sig: Place 1 Applicatorful vaginally at bedtime. 7days    Dispense:  70 g    Refill:  0    Order Specific Question:   Supervising Provider    Answer:   SCHMITZ, JEREMY E [[id number]]  . metroNIDAZOLE (FLAGYL) 500 MG tablet    Sig: Take 4 tablets (2,000 mg total) by mouth once for 1 dose.    Dispense:  4 tablet    Refill:  0    Order Specific Question:   Supervising Provider    Answer:   Lucille Passy [[id number]]    Follow-up: Return if symptoms worsen or fail to improve.  Wilfred Lacy, NP

## 2017-08-22 LAB — URINE CULTURE
MICRO NUMBER:: 90977079
SPECIMEN QUALITY:: ADEQUATE

## 2017-08-24 ENCOUNTER — Encounter: Payer: Self-pay | Admitting: Nurse Practitioner

## 2017-08-24 LAB — CERVICOVAGINAL ANCILLARY ONLY
BACTERIAL VAGINITIS: NEGATIVE
Candida vaginitis: NEGATIVE
Chlamydia: NEGATIVE
NEISSERIA GONORRHEA: NEGATIVE
Trichomonas: POSITIVE — AB

## 2017-08-24 MED ORDER — METRONIDAZOLE 500 MG PO TABS: 2000.0000 mg | ORAL_TABLET | Freq: Once | ORAL | 0 refills | Status: AC

## 2017-08-24 NOTE — Addendum Note (Signed)
Addended by: Wilfred Lacy L on: 08/24/2017 05:01 PM   Modules accepted: Orders

## 2017-08-25 ENCOUNTER — Encounter: Payer: Self-pay | Admitting: Nurse Practitioner

## 2017-08-25 ENCOUNTER — Ambulatory Visit: Payer: Self-pay | Admitting: Family Medicine

## 2017-08-25 NOTE — Telephone Encounter (Signed)
Faxed all pertinent information to [facility] Attn: Grayce Sessions as Dalbert Batman is a reportable Dz/thx dmf

## 2017-08-25 NOTE — Telephone Encounter (Signed)
She called in with additional questions regarding a vaginal trichomoniasis diagnosis she found out about this morning. She has not started the Metronidazole yet because she just found out the results this morning.  She wanted to know if the Trich diagnosis would include having an odor like garlic and abd and back pain that she has had for 2 months prior to seeing Wilfred Lacy, NP on 08/20/17.  She mentioned she had "stepped out on my husband" when I mentioned her partner also needed to be treated.   She verbalized understanding but still had the above questions. (I see in the office notes where these symptoms were addressed during her OV with Baldo Ash).   I let her know these issues were addressed.   I tried to answer her questions however she still seemed unsure.  You can leave a detailed voicemail with the answer.  1.   Is there an odor related to the Trich infection?  2.   Does the abd and back pain she's been having for 2 months go along with the Trich diagnoses?

## 2017-08-25 NOTE — Telephone Encounter (Signed)
Spoke with the pt, answer her questions. Advise the pt if she has any more questions to call back.

## 2017-08-25 NOTE — Telephone Encounter (Signed)
Left a vm for the pt to call back.

## 2017-08-26 ENCOUNTER — Telehealth: Payer: Self-pay

## 2017-08-26 NOTE — Telephone Encounter (Signed)
Spoke with pt she was concerned about the medication and amount she received with the pharmacy. Pt was prescribed metrodizonole gel for 7 days but received prescription for 5 days , I called and verified with the pharmacy the orders that were sent in and pharmacist said prescription did say 7 days but came with 5 applicators because only need for 5 days . I spoke with pt again and advised her on the orders and information of the medication prescribed for the Trich were correctly prescribed by provider.   Copied from [place] [phone]. Topic: Quick Communication - See Telephone Encounter >> Aug 26, 2017  2:48 PM Hewitt Shorts wrote: Pt is needing to discuss further information about her lab resuts -she did not want to discuss with [facility] just states she has questions  Best number [phone]

## 2017-08-27 ENCOUNTER — Telehealth: Payer: Self-pay | Admitting: Family Medicine

## 2017-08-27 NOTE — Telephone Encounter (Signed)
Yes she has confirmed to me she already has taking her pills

## 2017-08-27 NOTE — Telephone Encounter (Signed)
Copied from [place] [id number]. Topic: Quick Communication - See Telephone Encounter >> Aug 27, 2017  2:53 PM Margot Ables wrote: CRM for notification. See Telephone encounter for: 08/27/17.  Pt states she was seen in April 2019 and had Tdap and Hep B vaccine. 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 Please notify pt when available via mychart or phone

## 2017-08-27 NOTE — Telephone Encounter (Signed)
With lab confirming trichomoniasis instead of BV. I will recommend use of oral metronidazole to appropriately treat trichomoniasis.

## 2017-08-27 NOTE — Telephone Encounter (Signed)
Letter created/pt aware/thx dmf

## 2017-08-30 ENCOUNTER — Encounter: Payer: Self-pay | Admitting: Nurse Practitioner

## 2017-08-30 NOTE — Telephone Encounter (Signed)
Patient called and state she has question in reference to her lab results. Please call [phone]

## 2017-08-30 NOTE — Telephone Encounter (Signed)
Spoke with the pt, appt scheduled with Nche 08/30/2017.

## 2017-08-30 NOTE — Telephone Encounter (Signed)
Spoke with the pt, appt scheduled with Nche 08/31/17.

## 2017-08-31 ENCOUNTER — Encounter: Payer: Self-pay | Admitting: Nurse Practitioner

## 2017-08-31 ENCOUNTER — Ambulatory Visit (INDEPENDENT_AMBULATORY_CARE_PROVIDER_SITE_OTHER): Payer: BLUE CROSS/BLUE SHIELD | Admitting: Nurse Practitioner

## 2017-08-31 ENCOUNTER — Other Ambulatory Visit (HOSPITAL_COMMUNITY)
Admission: RE | Admit: 2017-08-31 | Discharge: 2017-08-31 | Disposition: A | Discharge status: Home / Self Care | Payer: BLUE CROSS/BLUE SHIELD | Source: Ambulatory Visit | Attending: Nurse Practitioner | Admitting: Nurse Practitioner

## 2017-08-31 ENCOUNTER — Other Ambulatory Visit: Payer: BLUE CROSS/BLUE SHIELD

## 2017-08-31 VITALS — BP 110/82 | HR 57 | Temp 98.2°F | Ht 65.0 in | Wt 257.0 lb

## 2017-08-31 DIAGNOSIS — A599 Trichomoniasis, unspecified: Secondary | ICD-10-CM | POA: Diagnosis not present

## 2017-08-31 DIAGNOSIS — Z113 Encounter for screening for infections with a predominantly sexual mode of transmission: Secondary | ICD-10-CM | POA: Diagnosis not present

## 2017-08-31 NOTE — Patient Instructions (Signed)
Herpes Simplex Test There are two common types of herpes simplex virus (HSV). These are classified as Type 1 (HSV1) or Type 2 (HSV2). Type 1 often causes cold sores on or around the mouth and sometimes on or around the eyes. Type 2 is commonly known as a sexually transmitted infection that causes sores in and around the genitals. Both types of herpes simplex can cause sores in different areas. There are two types of herpes simplex tests. These include:  Culture. This consists of collecting and testing a sample of fluid with a cotton swab from an open sore. This can only be done during an active infection (outbreak). Culture tests take several days to complete but are very accurate.  HSV blood tests. This test is not as accurate as a culture. However, HSV blood tests are faster than cultures, often providing a test result within one day. ? HSV antibody test. This checks for the presence of antibodies against HSV in your blood. Antibodies are proteins your body makes to help fight infection. ? HSV antigen test. This checks for the presence of the HSV germ (antigen) in your blood.  Your health care provider may recommend you have a HSV test if:  He or she believes you have a HSV infection.  You have a weakened immune system (immunocompromised) and you have sores around your mouth or genitals that look like HSV eruptions.  You have a fever of unknown origin (FUO).  You are pregnant, have herpes, and are expecting to deliver a baby vaginally in the next 6-8 weeks.  What do the results mean? It is your responsibility to obtain your test results. Ask the lab or department performing the test when and how you will get your results. Contact your health care provider to discuss any questions you have about your results. Range of Normal Values Ranges for normal values may vary among different labs and hospitals. You should always check with your health care provider after having lab work or other tests  done to discuss whether your values are considered within normal limits. Normal findings include:  No HSV antigen or antibodies present in your blood.  No HSV antigen present in cultured fluid.  Meaning of Results Outside Normal Ranges The following test results may indicate that you have an active HSV infection:  Positive culture for HSV1 or HSV2.  Presence of HSV1 or HSV2 antigens in your blood.  Presence of certain HSV1 or HSV2 antibodies (IgM) in your blood.  Discuss your test results with your health care provider. He or she will use the results to make a diagnosis and determine a treatment plan that is right for you. Talk with your health care provider to discuss your results, treatment options, and if necessary, the need for more tests. Talk with your health care provider if you have any questions about your results. This information is not intended to replace advice given to you by your health care provider. Make sure you discuss any questions you have with your health care provider. Document Released: 01/25/2004 Document Revised: 08/28/2015 Document Reviewed: 05/09/2013 Elsevier Interactive Patient Education  2018 [employer].   Genital Herpes Genital herpes is a common sexually transmitted infection (STI) that is caused by a virus. The virus spreads from person to person through sexual contact. Infection can cause itching, blisters, and sores around the genitals or rectum. Symptoms may last several days and then go away This is called an outbreak. However, the virus remains in your body, so you  may have more outbreaks in the future. The time between outbreaks varies and can be months or years. Genital herpes affects men and women. It is particularly concerning for pregnant women because the virus can be passed to the baby during delivery and can cause serious problems. Genital herpes is also a concern for people who have a weak disease-fighting (immune) system. What are the  causes? This condition is caused by the herpes simplex virus (HSV) type 1 or type 2. The virus may spread through:  Sexual contact with an infected person, including vaginal, anal, and oral sex.  Contact with fluid from a herpes sore.  The skin. This means that you can get herpes from an infected partner even if he or she does not have a visible sore or does not know that he or she is infected.  What increases the risk? You are more likely to develop this condition if:  You have sex with many partners.  You do not use latex condoms during sex.  What are the signs or symptoms? Most people do not have symptoms (asymptomatic) or have mild symptoms that may be mistaken for other skin problems. Symptoms may include:  Small red bumps near the genitals, rectum, or mouth. These bumps turn into blisters and then turn into sores.  Flu-like symptoms, including: ? Fever. ? Body aches. ? Swollen lymph nodes. ? Headache.  Painful urination.  Pain and itching in the genital area or rectal area.  Vaginal discharge.  Tingling or shooting pain in the legs and buttocks.  Generally, symptoms are more severe and last longer during the first (primary) outbreak. Flu-like symptoms are also more common during the primary outbreak. How is this diagnosed? Genital herpes may be diagnosed based on:  A physical exam.  Your medical history.  Blood tests.  A test of a fluid sample (culture) from an open sore.  How is this treated? There is no cure for this condition, but treatment with antiviral medicines that are taken by mouth (orally) can do the following:  Speed up healing and relieve symptoms.  Help to reduce the spread of the virus to sexual partners.  Limit the chance of future outbreaks, or make future outbreaks shorter.  Lessen symptoms of future outbreaks.  Your health care provider may also recommend pain relief medicines, such as aspirin or ibuprofen. Follow these instructions  at home: Sexual activity  Do not have sexual contact during active outbreaks.  Practice safe sex. Latex condoms and female condoms may help prevent the spread of the herpes virus. General instructions  Keep the affected areas dry and clean.  Take over-the-counter and prescription medicines only as told by your health care provider.  Avoid rubbing or touching blisters and sores. If you do touch blisters or sores: ? Wash your hands thoroughly with soap and water. ? Do not touch your eyes afterward.  To help relieve pain or itching, you may take the following actions as directed by your health care provider: ? Apply a cold, wet cloth (cold compress) to affected areas 4-6 times a day. ? Apply a substance that protects your skin and reduces bleeding (astringent). ? Apply a gel that helps relieve pain around sores (lidocaine gel). ? Take a warm, shallow bath that cleans the genital area (sitz bath).  Keep all follow-up visits as told by your health care provider. This is important. How is this prevented?  Use condoms. Although anyone can get genital herpes during sexual contact, even with the use of  a condom, a condom can provide some protection.  Avoid having multiple sexual partners.  Talk with your sexual partner about any symptoms either of you may have. Also, talk with your partner about any history of STIs.  Get tested for STIs before you have sex. Ask your partner to do the same.  Do not have sexual contact if you have symptoms of genital herpes. Contact a health care provider if:  Your symptoms are not improving with medicine.  Your symptoms return.  You have new symptoms.  You have a fever.  You have abdominal pain.  You have redness, swelling, or pain in your eye.  You notice new sores on other parts of your body.  You are a woman and experience bleeding between menstrual periods.  You have had herpes and you become pregnant or plan to become  pregnant. Summary  Genital herpes is a common sexually transmitted infection (STI) that is caused by the herpes simplex virus (HSV) type 1 or type 2.  These viruses are most often spread through sexual contact with an infected person.  You are more likely to develop this condition if you have sex with many partners or you have unprotected sex.  Most people do not have symptoms (asymptomatic) or have mild symptoms that may be mistaken for other skin problems. Symptoms occur as outbreaks that may happen months or years apart.  There is no cure for this condition, but treatment with oral antiviral medicines can reduce symptoms, reduce the chance of spreading the virus to a partner, prevent future outbreaks, or shorten future outbreaks. This information is not intended to replace advice given to you by your health care provider. Make sure you discuss any questions you have with your health care provider. Document Released: 12/20/1999 Document Revised: 11/22/2015 Document Reviewed: 11/22/2015 Elsevier Interactive Patient Education  [employer].

## 2017-08-31 NOTE — Progress Notes (Signed)
Subjective:  Patient ID: Gloria Flynn, female    DOB: 15-Jun-1970  Age: 47 y.o. MRN: [id number]  CC: Follow-up (test consult for [facility], pt stated her spouse was tested positive (carrier) for herpes type II. pt wants to test for everything..denied any symptoms. )  HPI Gloria Flynn is here to discuss additional STI testing. She reports her husband tested positive for HSV2, so she will like to be tested and other possible STIs. She also treated for Trichomoniasis 1week ago. Needs to repeat teat to ensure appropriate treatment. Resolved vaginal discharge and odor.  Reviewed past Medical, Social and Family history today.  Outpatient Medications Prior to Visit  Medication Sig Dispense Refill  . levothyroxine (SYNTHROID, LEVOTHROID) 150 MCG tablet TAKE 1 TABLET (150 MCG TOTAL) BY MOUTH DAILY BEFORE BREAKFAST. 90 tablet 1  . metroNIDAZOLE (METROGEL) 0.75 % vaginal gel Place 1 Applicatorful vaginally at bedtime. 7days 70 g 0  . KLOR-CON M10 10 MEQ tablet TAKE 1 TABLET BY MOUTH DAILY AS NEEDED WITH HCTZ (Patient not taking: Reported on 08/20/2017) 30 tablet 0  . phentermine 15 MG capsule Take 1 capsule (15 mg total) by mouth every morning. (Patient not taking: Reported on 08/20/2017) 30 capsule 1   No facility-administered medications prior to visit.     ROS See HPI  Objective:  BP 110/82   Pulse (!) 57   Temp 98.2 F (36.8 C) (Oral)   Ht 5' 5"  (1.651 m)   Wt 257 lb (116.6 kg)   SpO2 100%   BMI 42.77 kg/m   BP Readings from Last 3 Encounters:  08/31/17 110/82  08/20/17 116/80  04/26/17 118/80    Wt Readings from Last 3 Encounters:  08/31/17 257 lb (116.6 kg)  08/20/17 260 lb (117.9 kg)  04/26/17 297 lb 3.2 oz (134.8 kg)   Physical Exam  Constitutional: She appears well-developed and well-nourished.  Cardiovascular: Normal rate.  Pulmonary/Chest: Effort normal.  Lymphadenopathy:    She has no cervical adenopathy.  Skin: No rash noted.  Vitals reviewed.   Lab Results    Component Value Date   WBC 4.9 10/07/2016   HGB 11.2 (L) 10/07/2016   HCT 35.1 (L) 10/07/2016   PLT 322.0 10/07/2016   GLUCOSE 79 10/07/2016   CHOL 175 10/07/2016   TRIG 97.0 10/07/2016   HDL 44.90 10/07/2016   LDLCALC 111 (H) 10/07/2016   ALT 10 10/07/2016   AST 13 10/07/2016   NA 137 10/07/2016   K 3.4 (L) 10/07/2016   CL 103 10/07/2016   CREATININE 0.82 10/07/2016   BUN 8 10/07/2016   CO2 28 10/07/2016   TSH 2.59 04/26/2017   HGBA1C 5.5 05/09/2013    Assessment & Plan:   [name] was seen today for follow-up.  Diagnoses and all orders for this visit:  Screening examination for STD (sexually transmitted disease) -     Cancel: HIV antibody (with reflex) -     Cancel: Hepatitis C Antibody -     Cancel: RPR -     Cancel: Hepatitis B Core Antibody, total -     HIV antibody; Future -     RPR; Future -     Hepatitis C Antibody; Future -     Hepatitis B Core Antibody, total; Future -     HSV(herpes simplex vrs) 1+2 ab-IgG; Future  Trichomoniasis -     Urine cytology ancillary only -     HIV antibody; Future -     RPR; Future -  Hepatitis C Antibody; Future -     Hepatitis B Core Antibody, total; Future   I am having Gloria Flynn maintain her KLOR-CON M10, phentermine, levothyroxine, and metroNIDAZOLE.  No orders of the defined types were placed in this encounter.   Follow-up: No follow-ups on file.  Wilfred Lacy, NP

## 2017-09-01 ENCOUNTER — Encounter: Payer: Self-pay | Admitting: Nurse Practitioner

## 2017-09-01 NOTE — Telephone Encounter (Signed)
Spoke with the pt.

## 2017-09-02 ENCOUNTER — Encounter: Payer: Self-pay | Admitting: Nurse Practitioner

## 2017-09-02 LAB — HEPATITIS C ANTIBODY
Hepatitis C Ab: NONREACTIVE
SIGNAL TO CUT-OFF: 0.03 (ref <1.00)

## 2017-09-02 LAB — HSV(HERPES SIMPLEX VRS) I + II AB-IGG
HSV 1 IGG, TYPE SPEC: 28.6 {index} — AB
HSV 2 IGG,TYPE SPECIFIC AB: 19.3 index — ABNORMAL HIGH

## 2017-09-02 LAB — HEPATITIS B CORE ANTIBODY, TOTAL: HEP B C TOTAL AB: NONREACTIVE

## 2017-09-02 LAB — TEST AUTHORIZATION

## 2017-09-02 LAB — RPR: RPR Ser Ql: NONREACTIVE

## 2017-09-02 LAB — HIV ANTIBODY (ROUTINE TESTING W REFLEX): HIV 1&2 Ab, 4th Generation: NONREACTIVE

## 2017-09-03 LAB — URINE CYTOLOGY ANCILLARY ONLY: Trichomonas: NEGATIVE

## 2017-09-13 ENCOUNTER — Other Ambulatory Visit (HOSPITAL_COMMUNITY)
Admission: RE | Admit: 2017-09-13 | Discharge: 2017-09-13 | Disposition: A | Discharge status: Home / Self Care | Payer: BLUE CROSS/BLUE SHIELD | Source: Ambulatory Visit | Attending: Family Medicine | Admitting: Family Medicine

## 2017-09-13 ENCOUNTER — Ambulatory Visit (INDEPENDENT_AMBULATORY_CARE_PROVIDER_SITE_OTHER): Payer: BLUE CROSS/BLUE SHIELD | Admitting: Family Medicine

## 2017-09-13 ENCOUNTER — Encounter: Payer: Self-pay | Admitting: Family Medicine

## 2017-09-13 VITALS — BP 134/88 | HR 76 | Temp 98.7°F | Ht 64.25 in | Wt 252.4 lb

## 2017-09-13 DIAGNOSIS — Z1239 Encounter for other screening for malignant neoplasm of breast: Secondary | ICD-10-CM

## 2017-09-13 DIAGNOSIS — Z0001 Encounter for general adult medical examination with abnormal findings: Secondary | ICD-10-CM

## 2017-09-13 DIAGNOSIS — E890 Postprocedural hypothyroidism: Secondary | ICD-10-CM | POA: Diagnosis not present

## 2017-09-13 DIAGNOSIS — Z01419 Encounter for gynecological examination (general) (routine) without abnormal findings: Secondary | ICD-10-CM | POA: Insufficient documentation

## 2017-09-13 DIAGNOSIS — N898 Other specified noninflammatory disorders of vagina: Secondary | ICD-10-CM | POA: Insufficient documentation

## 2017-09-13 DIAGNOSIS — J069 Acute upper respiratory infection, unspecified: Secondary | ICD-10-CM | POA: Diagnosis not present

## 2017-09-13 DIAGNOSIS — R768 Other specified abnormal immunological findings in serum: Secondary | ICD-10-CM | POA: Diagnosis not present

## 2017-09-13 DIAGNOSIS — R05 Cough: Secondary | ICD-10-CM

## 2017-09-13 DIAGNOSIS — R059 Cough, unspecified: Secondary | ICD-10-CM

## 2017-09-13 MED ORDER — PROMETHAZINE-DM 6.25-15 MG/5ML PO SYRP: 5.0000 mL | ORAL_SOLUTION | Freq: Four times a day (QID) | ORAL | 0 refills | Status: DC | PRN

## 2017-09-13 NOTE — Assessment & Plan Note (Signed)
Continue current dose of synthroid.  Check labs today.

## 2017-09-13 NOTE — Progress Notes (Signed)
Subjective:   Patient ID: Gloria Flynn, female    DOB: [birth year], 47 y.o.   MRN: [id number]  Gloria Flynn is a pleasant 47 y.o. year old female who presents to clinic today with Annual Exam (Patient is here today for a CPE with PAP.  Several labs were completed at 8.27.19 OV with Wilfred Lacy, NP for STD screening.  Thyroid labs proved stable in April.  She is having some vaginal discharge.  She wants everything tested.)  on 09/13/2017  HPI:   Health Maintenance  Topic Date Due  . PAP SMEAR  05/09/2016  . INFLUENZA VACCINE  10/26/2017 (Originally 08/05/2017)  . TETANUS/TDAP  04/27/2027  . HIV Screening  Completed   Has lost 51 pounds with change in diet!  Post operative hypothyroidism- has been stable on synthroid 150 mcg daily.  Denies any symptoms of hypo or hyperthyroidism. Lab Results  Component Value Date   TSH 2.59 04/26/2017   Tested positive for HSV 1 and 2 weeks ago.  Saw Baldo Ash because after being treated for Trichomonas, her husband tested positive for HSV. She has not had any outbreaks. RPR, Hep B and HIV were all negative.  Due for mammogram.  URI- has had congestion, productive cough for a week.  Had subjective fever initially but that has resolved.  Now has mostly a productive cough and some nasal congestion. Current Outpatient Medications on File Prior to Visit  Medication Sig Dispense Refill  . levothyroxine (SYNTHROID, LEVOTHROID) 150 MCG tablet TAKE 1 TABLET (150 MCG TOTAL) BY MOUTH DAILY BEFORE BREAKFAST. 90 tablet 1   No current facility-administered medications on file prior to visit.     Allergies  Allergen Reactions  . Tessalon [Benzonatate]     Dizzy, drowsy and h/a    Past Medical History:  Diagnosis Date  . Goiter    Dr. Shawna Orleans in [place]  . HPV in female 2009  . Keloid   . Pregnancy induced hypertension   . Urinary tract infection     Past Surgical History:  Procedure Laterality Date  . CESAREAN SECTION    . THYROID SURGERY   09/22/2013    Family History  Problem Relation Age of Onset  . Diabetes Father   . Heart disease Father   . Anesthesia problems Neg Hx     Social History   Socioeconomic History  . Marital status: Married    Spouse name: Not on file  . Number of children: Not on file  . Years of education: Not on file  . Highest education level: Not on file  Occupational History  . Not on file  Social Needs  . Financial resource strain: Not on file  . Food insecurity:    Worry: Not on file    Inability: Not on file  . Transportation needs:    Medical: Not on file    Non-medical: Not on file  Tobacco Use  . Smoking status: Never Smoker  . Smokeless tobacco: Never Used  Substance and Sexual Activity  . Alcohol use: No  . Drug use: No  . Sexual activity: Yes    Partners: Male    Birth control/protection: None  Lifestyle  . Physical activity:    Days per week: Not on file    Minutes per session: Not on file  . Stress: Not on file  Relationships  . Social connections:    Talks on phone: Not on file    Gets together: Not on file    Attends religious  service: Not on file    Active member of club or organization: Not on file    Attends meetings of clubs or organizations: Not on file    Relationship status: Not on file  . Intimate partner violence:    Fear of current or ex partner: Not on file    Emotionally abused: Not on file    Physically abused: Not on file    Forced sexual activity: Not on file  Other Topics Concern  . Not on file  Social History Narrative  . Not on file   The PMH, PSH, Social History, Family History, Medications, and allergies have been reviewed in [facility], and have been updated if relevant.  Review of Systems  Constitutional: Negative.   HENT: Positive for congestion, postnasal drip, rhinorrhea and sore throat.   Eyes: Negative.   Respiratory: Positive for cough.   Cardiovascular: Negative.   Gastrointestinal: Negative.   Endocrine: Negative.     Genitourinary: Negative.   Musculoskeletal: Negative.   Skin: Negative.   Allergic/Immunologic: Negative.   Neurological: Negative.   Hematological: Negative.   Psychiatric/Behavioral: Negative.   All other systems reviewed and are negative.      Objective:    BP 134/88 (BP Location: Left Arm, Patient Position: Sitting, Cuff Size: Normal)   Pulse 76   Temp 98.7 F (37.1 C) (Oral)   Ht 5' 4.25" (1.632 m)   Wt 252 lb 6.4 oz (114.5 kg)   LMP 09/01/2017   SpO2 99%   BMI 42.99 kg/m   Wt Readings from Last 3 Encounters:  09/13/17 252 lb 6.4 oz (114.5 kg)  08/31/17 257 lb (116.6 kg)  08/20/17 260 lb (117.9 kg)     Physical Exam   General:  Well-developed,well-nourished,in no acute distress; alert,appropriate and cooperative throughout examination Head:  normocephalic and atraumatic.   Eyes:  vision grossly intact, PERRL Ears:  R ear normal and L ear normal externally, TMs clear bilaterally Nose:  no external deformity, + boggy turbinates Mouth:  good dentition, +PND Neck:  No deformities, masses, or tenderness noted. Breasts:  No mass, nodules, thickening, tenderness, bulging, retraction, inflamation, nipple discharge or skin changes noted.   Lungs:  Normal respiratory effort, chest expands symmetrically. Lungs are clear to auscultation, no crackles or wheezes. Heart:  Normal rate and regular rhythm. S1 and S2 normal without gallop, murmur, click, rub or other extra sounds. Abdomen:  Bowel sounds positive,abdomen soft and non-tender without masses, organomegaly or hernias noted. Rectal:  no external abnormalities.   Genitalia:  Pelvic Exam:        External: normal female genitalia without lesions or masses        Vagina: normal without lesions or masses        Cervix: normal without lesions or masses        Adnexa: normal bimanual exam without masses or fullness        Uterus: normal by palpation        Pap smear: performed Msk:  No deformity or scoliosis noted of  thoracic or lumbar spine.   Extremities:  No clubbing, cyanosis, edema, or deformity noted with normal full range of motion of all joints.   Neurologic:  alert & oriented X3 and gait normal.   Skin:  Intact without suspicious lesions or rashes Cervical Nodes:  No lymphadenopathy noted Axillary Nodes:  No palpable lymphadenopathy Psych:  Cognition and judgment appear intact. Alert and cooperative with normal attention span and concentration. No apparent delusions, illusions, hallucinations  Assessment & Plan:   Well woman exam with routine gynecological exam - Plan: Cytology - PAP  Vaginal discharge - Plan: Cytology - PAP No follow-ups on file.

## 2017-09-13 NOTE — Assessment & Plan Note (Signed)
Congratulated her on her weight loss!  Her goal is to lose 50 more pounds over the next 6 months.

## 2017-09-13 NOTE — Assessment & Plan Note (Signed)
Reviewed preventive care protocols, scheduled due services, and updated immunizations Discussed nutrition, exercise, diet, and healthy lifestyle.  Pap smear done today.

## 2017-09-13 NOTE — Patient Instructions (Signed)
You look great!  Please call the breast center at [phone] to schedule your mammogram.   I will call you with your lab results from today and you can view them online.

## 2017-09-13 NOTE — Assessment & Plan Note (Signed)
New- likely viral.  Lung exam reassruing. Symptomatic therapy suggested: push fluids, rest and return office visit prn if symptoms persist or worsen. Lack of antibiotic effectiveness discussed with her. Promethazine DM sent to pharmacy to take as needed for severe cough. She is aware of sedation precautions. Call or return to clinic prn if these symptoms worsen or fail to improve as anticipated.

## 2017-09-14 LAB — CBC WITH DIFFERENTIAL/PLATELET
Basophils Absolute: 0 10*3/uL (ref 0.0–0.1)
Basophils Relative: 0.9 % (ref 0.0–3.0)
Eosinophils Absolute: 0 10*3/uL (ref 0.0–0.7)
Eosinophils Relative: 0.9 % (ref 0.0–5.0)
HCT: 36.4 % (ref 36.0–46.0)
Hemoglobin: 11.9 g/dL — ABNORMAL LOW (ref 12.0–15.0)
Lymphocytes Relative: 49.4 % — ABNORMAL HIGH (ref 12.0–46.0)
Lymphs Abs: 2.2 10*3/uL (ref 0.7–4.0)
MCHC: 32.8 g/dL (ref 30.0–36.0)
MCV: 78.9 fl (ref 78.0–100.0)
Monocytes Absolute: 0.3 10*3/uL (ref 0.1–1.0)
Monocytes Relative: 7.2 % (ref 3.0–12.0)
Neutro Abs: 1.8 10*3/uL (ref 1.4–7.7)
Neutrophils Relative %: 41.6 % — ABNORMAL LOW (ref 43.0–77.0)
Platelets: 214 10*3/uL (ref 150.0–400.0)
RBC: 4.62 Mil/uL (ref 3.87–5.11)
RDW: 16.2 % — ABNORMAL HIGH (ref 11.5–15.5)
WBC: 4.4 10*3/uL (ref 4.0–10.5)

## 2017-09-14 LAB — T4, FREE: Free T4: 1.12 ng/dL (ref 0.60–1.60)

## 2017-09-14 LAB — LIPID PANEL
CHOLESTEROL: 146 mg/dL (ref 0–200)
HDL: 43.3 mg/dL (ref >39.00)
LDL CALC: 89 mg/dL (ref 0–99)
NONHDL: 102.26
Total CHOL/HDL Ratio: 3
Triglycerides: 65 mg/dL (ref 0.0–149.0)
VLDL: 13 mg/dL (ref 0.0–40.0)

## 2017-09-14 LAB — COMPREHENSIVE METABOLIC PANEL
ALT: 13 U/L (ref 0–35)
AST: 14 U/L (ref 0–37)
Albumin: 4 g/dL (ref 3.5–5.2)
Alkaline Phosphatase: 51 U/L (ref 39–117)
BUN: 10 mg/dL (ref 6–23)
CHLORIDE: 104 meq/L (ref 96–112)
CO2: 23 mEq/L (ref 19–32)
Calcium: 8.8 mg/dL (ref 8.4–10.5)
Creatinine, Ser: 0.81 mg/dL (ref 0.40–1.20)
GFR: 98.13 mL/min (ref >60.00)
GLUCOSE: 76 mg/dL (ref 70–99)
POTASSIUM: 3.5 meq/L (ref 3.5–5.1)
SODIUM: 137 meq/L (ref 135–145)
Total Bilirubin: 0.4 mg/dL (ref 0.2–1.2)
Total Protein: 6.9 g/dL (ref 6.0–8.3)

## 2017-09-14 LAB — TSH: TSH: 0.75 u[IU]/mL (ref 0.35–4.50)

## 2017-09-16 ENCOUNTER — Encounter: Payer: Self-pay | Admitting: Family Medicine

## 2017-09-16 LAB — CYTOLOGY - PAP
Bacterial vaginitis: NEGATIVE
Candida vaginitis: NEGATIVE
Chlamydia: NEGATIVE
Diagnosis: NEGATIVE
HPV (WINDOPATH): NOT DETECTED
NEISSERIA GONORRHEA: NEGATIVE
TRICH (WINDOWPATH): NEGATIVE

## 2017-09-16 LAB — CERVICOVAGINAL ANCILLARY ONLY: Herpes: NEGATIVE

## 2017-09-16 NOTE — Telephone Encounter (Unsigned)
Copied from [place] [phone]. Topic: General - Other >> Sep 16, 2017 12:43 PM Carolyn Stare wrote:  Pt said she had question about her lab and would like a call back

## 2017-10-07 ENCOUNTER — Ambulatory Visit
Admission: RE | Admit: 2017-10-07 | Discharge: 2017-10-07 | Disposition: A | Discharge status: Home / Self Care | Payer: BLUE CROSS/BLUE SHIELD | Source: Ambulatory Visit | Attending: Family Medicine | Admitting: Family Medicine

## 2017-10-07 DIAGNOSIS — Z1231 Encounter for screening mammogram for malignant neoplasm of breast: Secondary | ICD-10-CM | POA: Diagnosis not present

## 2017-10-07 DIAGNOSIS — Z1239 Encounter for other screening for malignant neoplasm of breast: Secondary | ICD-10-CM

## 2017-11-03 ENCOUNTER — Encounter: Payer: Self-pay | Admitting: Family Medicine

## 2017-11-03 DIAGNOSIS — E890 Postprocedural hypothyroidism: Secondary | ICD-10-CM

## 2017-11-04 ENCOUNTER — Encounter: Payer: Self-pay | Admitting: Family Medicine

## 2017-11-04 MED ORDER — LEVOTHYROXINE SODIUM 150 MCG PO TABS: 150.0000 ug | ORAL_TABLET | Freq: Every day | ORAL | 1 refills | Status: DC

## 2018-04-30 ENCOUNTER — Other Ambulatory Visit: Payer: Self-pay | Admitting: Family Medicine

## 2018-04-30 DIAGNOSIS — E890 Postprocedural hypothyroidism: Secondary | ICD-10-CM

## 2018-10-10 ENCOUNTER — Other Ambulatory Visit: Payer: Self-pay | Admitting: Family Medicine

## 2018-10-10 DIAGNOSIS — E890 Postprocedural hypothyroidism: Secondary | ICD-10-CM

## 2018-11-08 ENCOUNTER — Telehealth: Payer: Self-pay

## 2018-11-08 NOTE — Telephone Encounter (Signed)
I spoke with pt and scheduled her a follow up VV w Dr. Deborra Medina for medication refill.

## 2018-11-08 NOTE — Telephone Encounter (Signed)
Copied from [place] [id number]. Topic: General - Inquiry >> Nov 08, 2018  1:58 PM Percell Belt A wrote: Reason for CRM: pt called in to fu on mychart message : [facility] call [facility] my Levothyroxine 150 mg for a 90 day supply? Pharmacy - [id number] [place]- [phone](Phone)

## 2018-11-09 NOTE — Progress Notes (Signed)
Virtual Visit via Video   Due to the COVID-19 pandemic, this visit was completed with telemedicine (audio/video) technology to reduce patient and provider exposure as well as to preserve personal protective equipment.   I connected with Gustie Ricketts by a video enabled telemedicine application and verified that I am speaking with the correct person using two identifiers. Location patient: Home Location provider: [facility], Office Persons participating in the virtual visit: Evalie Hargraves, Penni Homans, MD   I discussed the limitations of evaluation and management by telemedicine and the availability of in person appointments. The patient expressed understanding and agreed to proceed.  Care Team   Patient Care Team: Lucille Passy, MD as PCP - General (Family Medicine)  Subjective:   HPI:    ROS   Patient Active Problem List   Diagnosis Date Noted   Vitamin D deficiency 11/30/2018   HSV-2 seropositive 09/13/2017   HTN (hypertension) 02/05/2014   Hypothyroidism 02/05/2014   S/P thyroidectomy 10/26/2013   Severe obesity (BMI >= 40) ([place]) 06/25/2011   Goiter 06/25/2011    Social History   Tobacco Use   Smoking status: Never Smoker   Smokeless tobacco: Never Used  Substance Use Topics   Alcohol use: No    Current Outpatient Medications:    levothyroxine (SYNTHROID) 150 MCG tablet, TAKE 1 TABLET BY MOUTH EVERY DAY BEFORE BREAKFAST, Disp: 90 tablet, Rfl: 3   promethazine-dextromethorphan (PROMETHAZINE-DM) 6.25-15 MG/5ML syrup, Take 5 mLs by mouth 4 (four) times daily as needed., Disp: 118 mL, Rfl: 0   Vitamin D, Ergocalciferol, (DRISDOL) 1.25 MG (50000 UNIT) CAPS capsule, TAKE 1 CAPSULE ONCE EVERY 7 DAYS, Disp: 12 capsule, Rfl: 0  Allergies  Allergen Reactions   Tessalon [Benzonatate]     Dizzy, drowsy and h/a    Objective:   VITALS: Per patient if applicable, see vitals. GENERAL: Alert, appears well and in no acute distress. HEENT: Atraumatic, conjunctiva  clear, no obvious abnormalities on inspection of external nose and ears. NECK: Normal movements of the head and neck. CARDIOPULMONARY: No increased WOB. Speaking in clear sentences. I:E ratio WNL.  MS: Moves all visible extremities without noticeable abnormality. PSYCH: Pleasant and cooperative, well-groomed. Speech normal rate and rhythm. Affect is appropriate. Insight and judgement are appropriate. Attention is focused, linear, and appropriate.  NEURO: CN grossly intact. Oriented as arrived to appointment on time with no prompting. Moves both UE equally.  SKIN: No obvious lesions, wounds, erythema, or cyanosis noted on face or hands.  Depression screen [facility] 2/9 11/30/2018 10/26/2016 03/06/2015  Decreased Interest 0 0 0  Down, Depressed, Hopeless 0 0 0  PHQ - 2 Score 0 0 0    Assessment and Plan:   Diagnoses and all orders for this visit:  Essential hypertension  Postoperative hypothyroidism -     Discontinue: levothyroxine (SYNTHROID) 150 MCG tablet; TAKE 1 TABLET BY MOUTH EVERY DAY BEFORE BREAKFAST   COVID-19 Education: The signs and symptoms of COVID-19 were discussed with the patient and how to seek care for testing if needed. The importance of social distancing was discussed today. Reviewed expectations re: course of current medical issues. Discussed self-management of symptoms. Outlined signs and symptoms indicating need for more acute intervention. Patient verbalized understanding and all questions were answered. Health Maintenance issues including appropriate healthy diet, exercise, and smoking avoidance were discussed with patient. See orders for this visit as documented in the electronic medical record. Patient not seen Penni Homans, MD  Records requested if needed. Time spent: 0 minutes,  of which >50% was spent in obtaining information about her symptoms, reviewing her previous labs, evaluations, and treatments, counseling her about her condition (please see the discussed  topics above), and developing a plan to further investigate it; she had a number of questions which I addressed.

## 2018-11-10 ENCOUNTER — Other Ambulatory Visit: Payer: Self-pay

## 2018-11-10 ENCOUNTER — Ambulatory Visit: Payer: BC Managed Care – PPO | Admitting: Family Medicine

## 2018-11-10 DIAGNOSIS — I10 Essential (primary) hypertension: Secondary | ICD-10-CM

## 2018-11-10 DIAGNOSIS — Z9889 Other specified postprocedural states: Secondary | ICD-10-CM

## 2018-11-10 DIAGNOSIS — E890 Postprocedural hypothyroidism: Secondary | ICD-10-CM

## 2018-11-10 MED ORDER — LEVOTHYROXINE SODIUM 150 MCG PO TABS: ORAL_TABLET | ORAL | 0 refills | Status: DC

## 2018-11-10 NOTE — Progress Notes (Signed)
Per TA created future orders/pt is scheduled for a lab visit on 11.23.20 and a VV with TA on 11.25.20 to discuss lab results/thx dmf

## 2018-11-28 ENCOUNTER — Other Ambulatory Visit: Payer: Self-pay

## 2018-11-28 ENCOUNTER — Other Ambulatory Visit (INDEPENDENT_AMBULATORY_CARE_PROVIDER_SITE_OTHER): Payer: BC Managed Care – PPO

## 2018-11-28 DIAGNOSIS — E890 Postprocedural hypothyroidism: Secondary | ICD-10-CM | POA: Diagnosis not present

## 2018-11-28 DIAGNOSIS — I10 Essential (primary) hypertension: Secondary | ICD-10-CM | POA: Diagnosis not present

## 2018-11-28 DIAGNOSIS — Z9889 Other specified postprocedural states: Secondary | ICD-10-CM | POA: Diagnosis not present

## 2018-11-28 LAB — COMPREHENSIVE METABOLIC PANEL
ALT: 10 U/L (ref 0–35)
AST: 15 U/L (ref 0–37)
Albumin: 3.9 g/dL (ref 3.5–5.2)
Alkaline Phosphatase: 59 U/L (ref 39–117)
BUN: 10 mg/dL (ref 6–23)
CO2: 25 mEq/L (ref 19–32)
Calcium: 8.8 mg/dL (ref 8.4–10.5)
Chloride: 105 mEq/L (ref 96–112)
Creatinine, Ser: 0.87 mg/dL (ref 0.40–1.20)
GFR: 84.57 mL/min (ref >60.00)
Glucose, Bld: 82 mg/dL (ref 70–99)
Potassium: 3.7 mEq/L (ref 3.5–5.1)
Sodium: 138 mEq/L (ref 135–145)
Total Bilirubin: 0.4 mg/dL (ref 0.2–1.2)
Total Protein: 7 g/dL (ref 6.0–8.3)

## 2018-11-28 LAB — CBC WITH DIFFERENTIAL/PLATELET
Basophils Absolute: 0.1 10*3/uL (ref 0.0–0.1)
Basophils Relative: 2.7 % (ref 0.0–3.0)
Eosinophils Absolute: 0.1 10*3/uL (ref 0.0–0.7)
Eosinophils Relative: 2 % (ref 0.0–5.0)
HCT: 35.1 % — ABNORMAL LOW (ref 36.0–46.0)
Hemoglobin: 11.5 g/dL — ABNORMAL LOW (ref 12.0–15.0)
Lymphocytes Relative: 42.9 % (ref 12.0–46.0)
Lymphs Abs: 1.4 10*3/uL (ref 0.7–4.0)
MCHC: 32.6 g/dL (ref 30.0–36.0)
MCV: 78.5 fl (ref 78.0–100.0)
Monocytes Absolute: 0.4 10*3/uL (ref 0.1–1.0)
Monocytes Relative: 11.9 % (ref 3.0–12.0)
Neutro Abs: 1.4 10*3/uL (ref 1.4–7.7)
Neutrophils Relative %: 40.5 % — ABNORMAL LOW (ref 43.0–77.0)
Platelets: 286 10*3/uL (ref 150.0–400.0)
RBC: 4.48 Mil/uL (ref 3.87–5.11)
RDW: 16.2 % — ABNORMAL HIGH (ref 11.5–15.5)
WBC: 3.4 10*3/uL — ABNORMAL LOW (ref 4.0–10.5)

## 2018-11-28 LAB — LIPID PANEL
Cholesterol: 181 mg/dL (ref 0–200)
HDL: 50.4 mg/dL (ref >39.00)
LDL Cholesterol: 116 mg/dL — ABNORMAL HIGH (ref 0–99)
NonHDL: 130.71
Total CHOL/HDL Ratio: 4
Triglycerides: 73 mg/dL (ref 0.0–149.0)
VLDL: 14.6 mg/dL (ref 0.0–40.0)

## 2018-11-28 LAB — VITAMIN D 25 HYDROXY (VIT D DEFICIENCY, FRACTURES): VITD: 17.19 ng/mL — ABNORMAL LOW (ref 30.00–100.00)

## 2018-11-28 LAB — TSH: TSH: 0.91 u[IU]/mL (ref 0.35–4.50)

## 2018-11-28 LAB — T4, FREE: Free T4: 1.26 ng/dL (ref 0.60–1.60)

## 2018-11-29 LAB — T3: T3, Total: 108 ng/dL (ref 76–181)

## 2018-11-29 NOTE — Progress Notes (Signed)
Virtual Visit via Video   Due to the COVID-19 pandemic, this visit was completed with telemedicine (audio/video) technology to reduce patient and provider exposure as well as to preserve personal protective equipment.   I connected with Gloria Flynn by a video enabled telemedicine application and verified that I am speaking with the correct person using two identifiers. Location patient: Home Location provider: [facility], Office Persons participating in the virtual visit: Ida Rogue, Arnette Norris, MD   I discussed the limitations of evaluation and management by telemedicine and the availability of in person appointments. The patient expressed understanding and agreed to proceed.  Care Team   Patient Care Team: Lucille Passy, MD as PCP - General (Family Medicine)  Subjective:   HPI:   Has lost 50 pounds with change in diet!    Doing well with with working from during [place].  Wt Readings from Last 3 Encounters:  11/30/18 252 lb (114.3 kg)  09/13/17 252 lb 6.4 oz (114.5 kg)  08/31/17 257 lb (116.6 kg)     BP has now been controlled without rx.  BP Readings from Last 3 Encounters:  11/30/18 132/88  09/13/17 134/88  08/31/17 110/82     Post operative hypothyroidism- has been stable on synthroid 150 mcg daily.  Denies any symptoms of hypo or hyperthyroidism.  Lab Results  Component Value Date   TSH 0.91 11/28/2018   T3TOTAL 108 11/28/2018   Vitamin D deficiency-  Lab Results  Component Value Date   VD25OH 17.19 (L) 11/28/2018       Patient Active Problem List   Diagnosis Date Noted  . Vitamin D deficiency 11/30/2018  . HSV-2 seropositive 09/13/2017  . HTN (hypertension) 02/05/2014  . Hypothyroidism 02/05/2014  . S/P thyroidectomy 10/26/2013  . Severe obesity (BMI >= 40) ([place]) 06/25/2011  . Goiter 06/25/2011    Social History   Tobacco Use  . Smoking status: Never Smoker  . Smokeless tobacco: Never Used  Substance Use Topics  . Alcohol  use: No    Current Outpatient Medications:  .  levothyroxine (SYNTHROID) 150 MCG tablet, TAKE 1 TABLET BY MOUTH EVERY DAY BEFORE BREAKFAST, Disp: 90 tablet, Rfl: 3 .  promethazine-dextromethorphan (PROMETHAZINE-DM) 6.25-15 MG/5ML syrup, Take 5 mLs by mouth 4 (four) times daily as needed., Disp: 118 mL, Rfl: 0 .  Vitamin D, Ergocalciferol, (DRISDOL) 1.25 MG (50000 UT) CAPS capsule, Take 1 capsule (50,000 Units total) by mouth every 7 (seven) days., Disp: 12 capsule, Rfl: 0  Allergies  Allergen Reactions  . Tessalon [Benzonatate]     Dizzy, drowsy and h/a    Objective:  BP 132/88   Wt 252 lb (114.3 kg)   BMI 42.92 kg/m   VITALS: Per patient if applicable, see vitals. GENERAL: Alert, appears well and in no acute distress. HEENT: Atraumatic, conjunctiva clear, no obvious abnormalities on inspection of external nose and ears. NECK: Normal movements of the head and neck. CARDIOPULMONARY: No increased WOB. Speaking in clear sentences. I:E ratio WNL.  MS: Moves all visible extremities without noticeable abnormality. PSYCH: Pleasant and cooperative, well-groomed. Speech normal rate and rhythm. Affect is appropriate. Insight and judgement are appropriate. Attention is focused, linear, and appropriate.  NEURO: CN grossly intact. Oriented as arrived to appointment on time with no prompting. Moves both UE equally.  SKIN: No obvious lesions, wounds, erythema, or cyanosis noted on face or hands.  Depression screen [facility] 2/9 11/30/2018 10/26/2016 03/06/2015  Decreased Interest 0 0 0  Down, Depressed, Hopeless 0 0 0  PHQ - 2 Score 0 0 0     Review of Systems  Constitutional: Positive for malaise/fatigue. Negative for fever.  HENT: Negative for congestion, hearing loss and sore throat.   Eyes: Negative.  Negative for blurred vision, discharge and redness.  Respiratory: Negative.  Negative for cough and shortness of breath.   Cardiovascular: Negative.  Negative for chest pain, palpitations and leg  swelling.  Gastrointestinal: Negative.  Negative for abdominal pain and heartburn.  Genitourinary: Negative.  Negative for dysuria.  Musculoskeletal: Positive for myalgias. Negative for falls.  Skin: Negative.  Negative for rash.  Neurological: Negative for loss of consciousness and headaches.  Endo/Heme/Allergies: Negative for environmental allergies and polydipsia. Does not bruise/bleed easily.       + feels cold  Psychiatric/Behavioral: Negative.  Negative for depression, hallucinations, memory loss, substance abuse and suicidal ideas. The patient is not nervous/anxious and does not have insomnia.         Lab Results  Component Value Date   WBC 3.4 (L) 11/28/2018   HGB 11.5 (L) 11/28/2018   HCT 35.1 (L) 11/28/2018   PLT 286.0 11/28/2018   GLUCOSE 82 11/28/2018   CHOL 181 11/28/2018   TRIG 73.0 11/28/2018   HDL 50.40 11/28/2018   LDLCALC 116 (H) 11/28/2018   ALT 10 11/28/2018   AST 15 11/28/2018   NA 138 11/28/2018   K 3.7 11/28/2018   CL 105 11/28/2018   CREATININE 0.87 11/28/2018   BUN 10 11/28/2018   CO2 25 11/28/2018   TSH 0.91 11/28/2018   HGBA1C 5.5 05/09/2013    Lab Results  Component Value Date   TSH 0.91 11/28/2018   Lab Results  Component Value Date   WBC 3.4 (L) 11/28/2018   HGB 11.5 (L) 11/28/2018   HCT 35.1 (L) 11/28/2018   MCV 78.5 11/28/2018   PLT 286.0 11/28/2018   Lab Results  Component Value Date   NA 138 11/28/2018   K 3.7 11/28/2018   CO2 25 11/28/2018   GLUCOSE 82 11/28/2018   BUN 10 11/28/2018   CREATININE 0.87 11/28/2018   BILITOT 0.4 11/28/2018   ALKPHOS 59 11/28/2018   AST 15 11/28/2018   ALT 10 11/28/2018   PROT 7.0 11/28/2018   ALBUMIN 3.9 11/28/2018   CALCIUM 8.8 11/28/2018   ANIONGAP 11 09/11/2016   GFR 84.57 11/28/2018   Lab Results  Component Value Date   CHOL 181 11/28/2018   Lab Results  Component Value Date   HDL 50.40 11/28/2018   Lab Results  Component Value Date   LDLCALC 116 (H) 11/28/2018   Lab  Results  Component Value Date   TRIG 73.0 11/28/2018   Lab Results  Component Value Date   CHOLHDL 4 11/28/2018   Lab Results  Component Value Date   HGBA1C 5.5 05/09/2013    The ASCVD Risk score (Goff DC Jr., et al., 2013) failed to calculate for the following reasons:   Unable to determine if patient is Non-Hispanic African American  Assessment & Plan:   Problem List Items Addressed This Visit      Active Problems   Severe obesity (BMI >= 40) ([place])    Has maintained her weight despite working from home!  Congratulated her on her success.  Advised to incorporate walking, even if around the house.  The patient indicates understanding of these issues and agrees with the plan.       HTN (hypertension) - Primary   Hypothyroidism    Clinically euthyroid- eRX refills  sent to pharmacy on file.      Relevant Medications   levothyroxine (SYNTHROID) 150 MCG tablet   Vitamin D deficiency    New- Very low. Lab Results  Component Value Date   VD25OH 17.19 (L) 11/28/2018    She has been feeling more tired and achy. eRx sent for Vit D3 50,000 units x 12 weeks - 1 tab po weekly x 12 weeks (number 12, no refills), then continue VitD 2000 IU daily.  Recheck in 8-12 weeks.  The patient indicates understanding of these issues and agrees with the plan.          I am having Gloria Flynn start on Vitamin D (Ergocalciferol). I am also having her maintain her promethazine-dextromethorphan and levothyroxine.   Marland Kitchen COVID-19 Education: The signs and symptoms of COVID-19 were discussed with the patient and how to seek care for testing if needed. The importance of social distancing was discussed today. . Reviewed expectations re: course of current medical issues. . Discussed self-management of symptoms. . Outlined signs and symptoms indicating need for more acute intervention. . Patient verbalized understanding and all questions were answered. Marland Kitchen Health Maintenance issues including  appropriate healthy diet, exercise, and smoking avoidance were discussed with patient. . See orders for this visit as documented in the electronic medical record.  Arnette Norris, MD  Records requested if needed. Time spent: 25 minutes, of which >50% was spent in obtaining information about her symptoms, reviewing her previous labs, evaluations, and treatments, counseling her about her condition (please see the discussed topics above), and developing a plan to further investigate it; she had a number of questions which I addressed.

## 2018-11-30 ENCOUNTER — Encounter: Payer: Self-pay | Admitting: Family Medicine

## 2018-11-30 ENCOUNTER — Telehealth (INDEPENDENT_AMBULATORY_CARE_PROVIDER_SITE_OTHER): Payer: BC Managed Care – PPO | Admitting: Family Medicine

## 2018-11-30 VITALS — BP 132/88 | Wt 252.0 lb

## 2018-11-30 DIAGNOSIS — E559 Vitamin D deficiency, unspecified: Secondary | ICD-10-CM | POA: Insufficient documentation

## 2018-11-30 DIAGNOSIS — E890 Postprocedural hypothyroidism: Secondary | ICD-10-CM

## 2018-11-30 DIAGNOSIS — Z6841 Body Mass Index (BMI) 40.0 and over, adult: Secondary | ICD-10-CM

## 2018-11-30 DIAGNOSIS — I10 Essential (primary) hypertension: Secondary | ICD-10-CM | POA: Diagnosis not present

## 2018-11-30 MED ORDER — VITAMIN D (ERGOCALCIFEROL) 1.25 MG (50000 UNIT) PO CAPS: 50000.0000 [IU] | ORAL_CAPSULE | ORAL | 0 refills | Status: DC

## 2018-11-30 MED ORDER — LEVOTHYROXINE SODIUM 150 MCG PO TABS: ORAL_TABLET | ORAL | 3 refills | Status: DC

## 2018-11-30 NOTE — Assessment & Plan Note (Signed)
Has maintained her weight despite working from home!  Congratulated her on her success.  Advised to incorporate walking, even if around the house.  The patient indicates understanding of these issues and agrees with the plan.

## 2018-11-30 NOTE — Assessment & Plan Note (Signed)
Clinically euthyroid- eRX refills sent to pharmacy on file.

## 2018-11-30 NOTE — Assessment & Plan Note (Signed)
New- Very low. Lab Results  Component Value Date   VD25OH 17.19 (L) 11/28/2018    She has been feeling more tired and achy. eRx sent for Vit D3 50,000 units x 12 weeks - 1 tab po weekly x 12 weeks (number 12, no refills), then continue VitD 2000 IU daily.  Recheck in 8-12 weeks.  The patient indicates understanding of these issues and agrees with the plan.

## 2019-02-14 ENCOUNTER — Other Ambulatory Visit: Payer: Self-pay | Admitting: Family Medicine

## 2019-04-18 IMAGING — CR DG CHEST 2V
2 series · 2 of 2 positions shown · non-contrast
Comparison: 12/17/2009

CLINICAL DATA: Left-sided chest pain

EXAM:
CHEST  2 VIEW

[w chest pa]
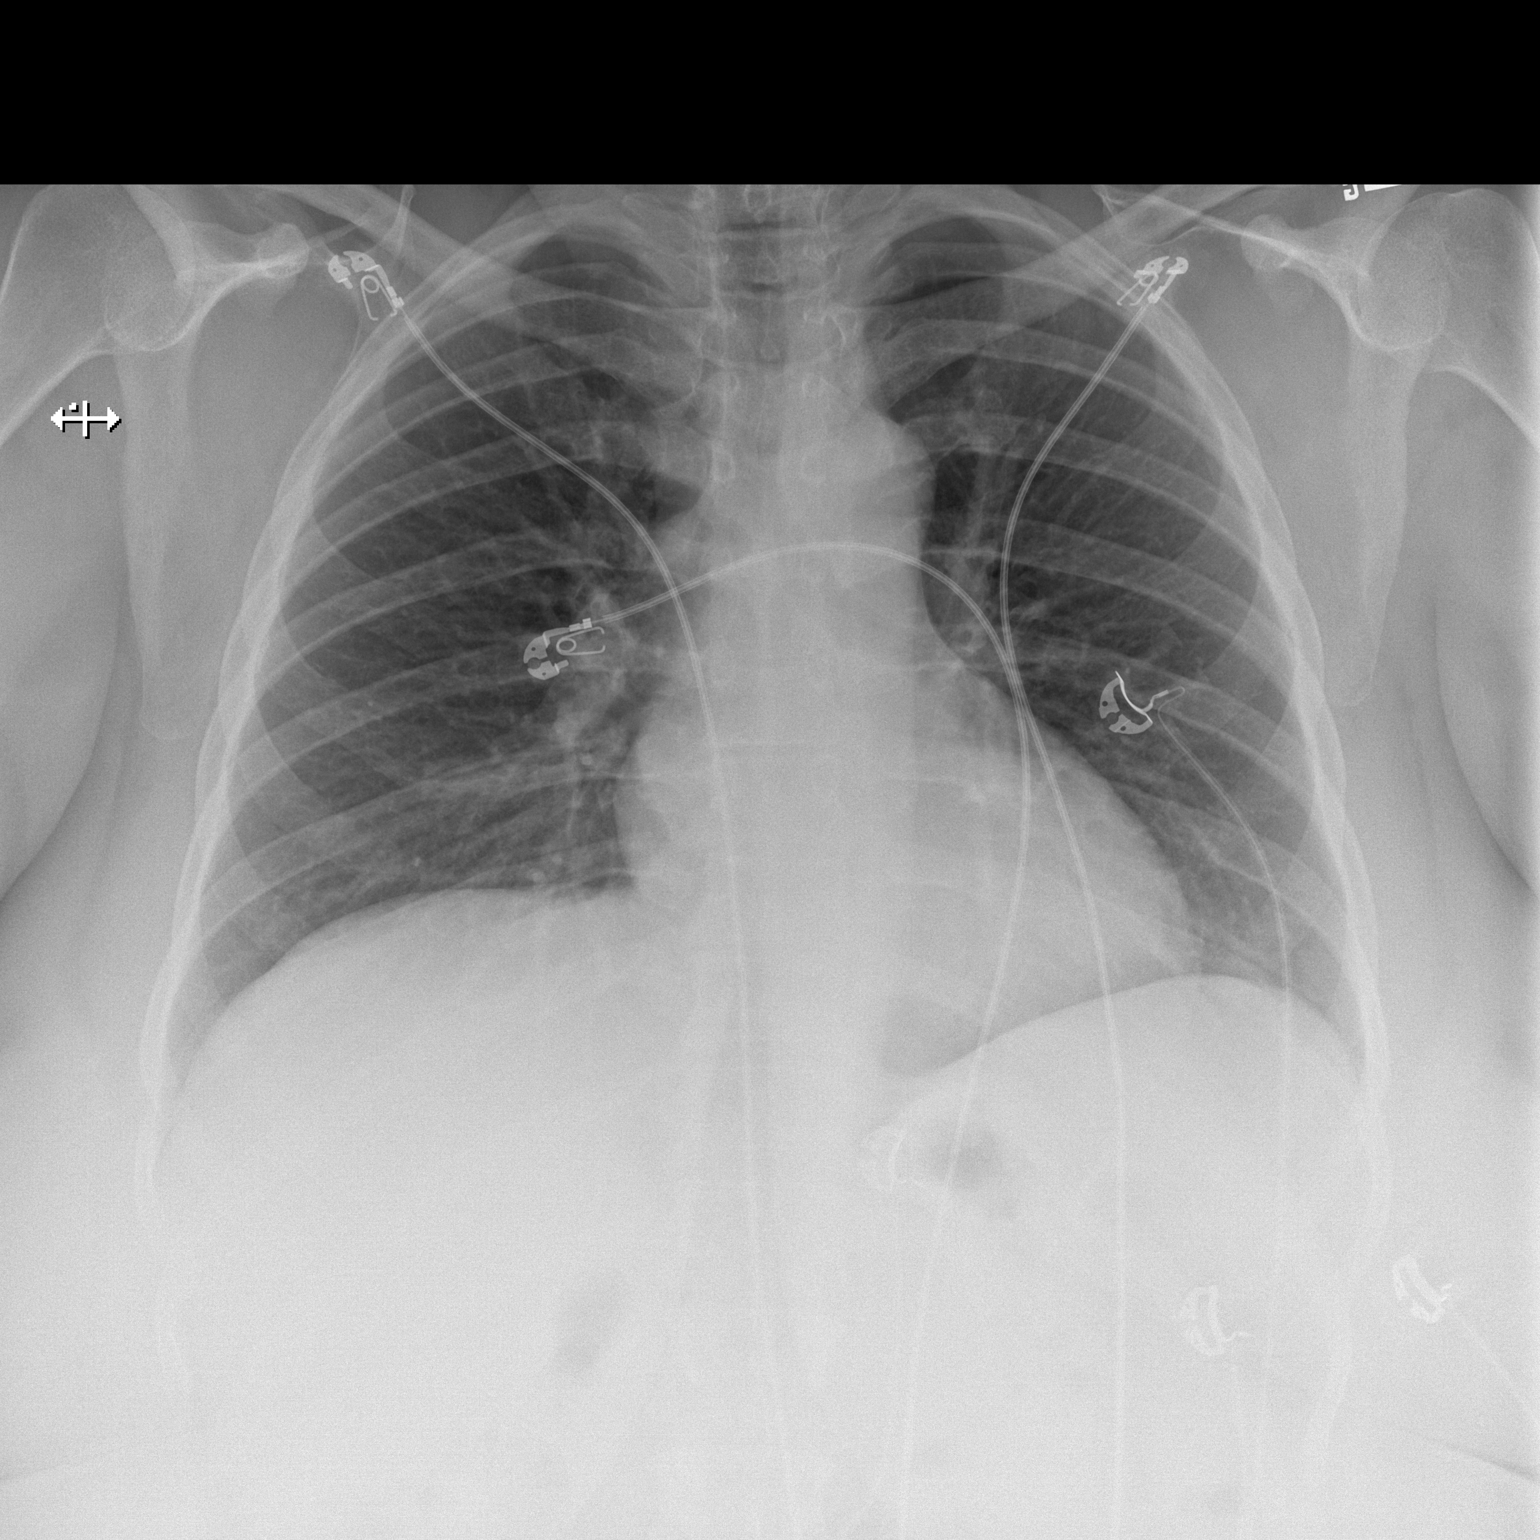

[w chest lat]
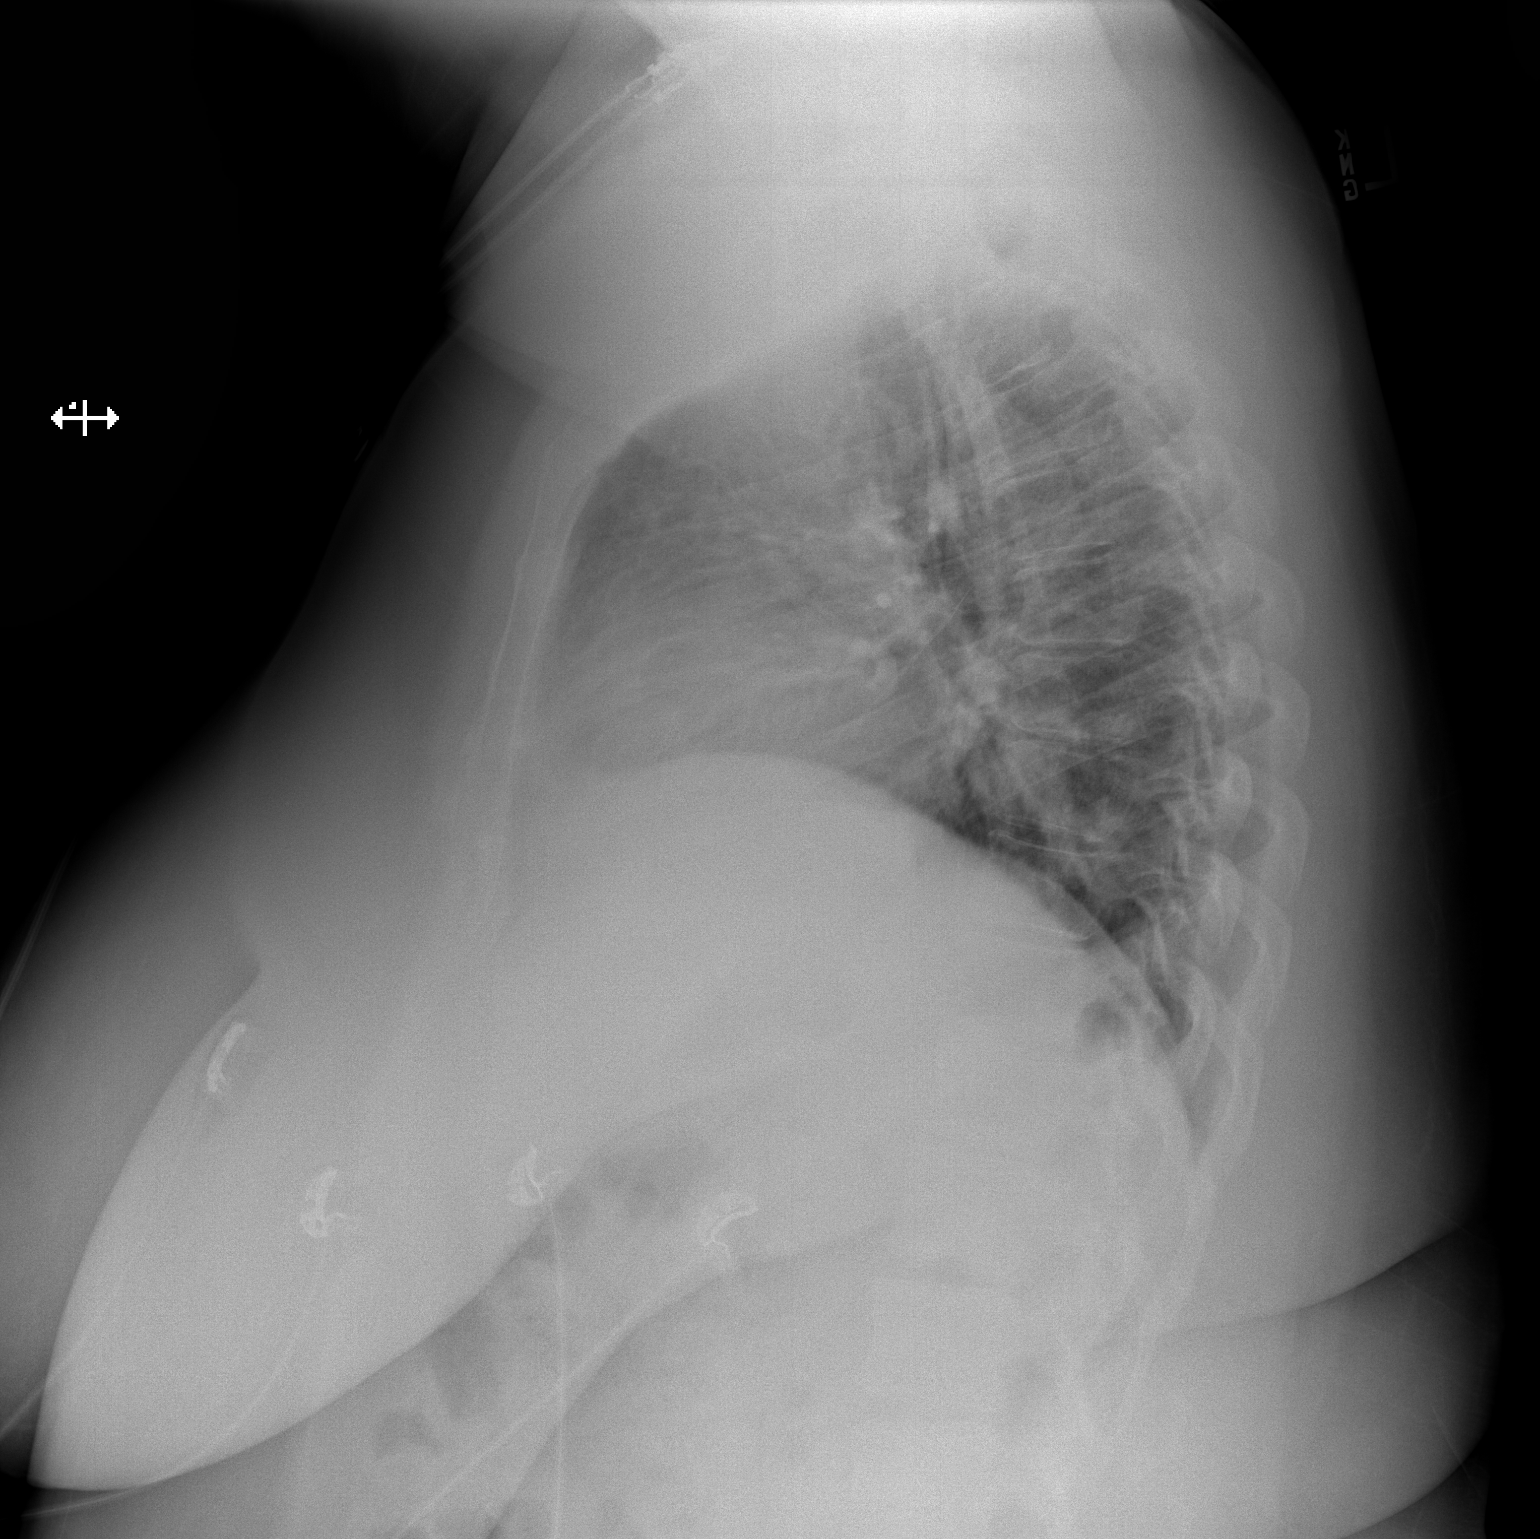

[2 of 2 positions shown; findings below may reference images not displayed]

FINDINGS: The heart size and mediastinal contours are within normal limits.
Both lungs are clear. The visualized skeletal structures are
unremarkable.
IMPRESSION: No active cardiopulmonary disease.

## 2019-10-24 ENCOUNTER — Ambulatory Visit (INDEPENDENT_AMBULATORY_CARE_PROVIDER_SITE_OTHER): Payer: BC Managed Care – PPO | Admitting: Nurse Practitioner

## 2019-10-24 ENCOUNTER — Other Ambulatory Visit: Payer: Self-pay

## 2019-10-24 ENCOUNTER — Encounter: Payer: Self-pay | Admitting: Nurse Practitioner

## 2019-10-24 VITALS — BP 140/88 | HR 76 | Temp 97.3°F | Ht 64.25 in | Wt 284.2 lb

## 2019-10-24 DIAGNOSIS — E890 Postprocedural hypothyroidism: Secondary | ICD-10-CM | POA: Diagnosis not present

## 2019-10-24 DIAGNOSIS — E559 Vitamin D deficiency, unspecified: Secondary | ICD-10-CM | POA: Diagnosis not present

## 2019-10-24 DIAGNOSIS — I10 Essential (primary) hypertension: Secondary | ICD-10-CM | POA: Diagnosis not present

## 2019-10-24 LAB — T4, FREE: Free T4: 1.13 ng/dL (ref 0.60–1.60)

## 2019-10-24 LAB — TSH: TSH: 0.42 u[IU]/mL (ref 0.35–4.50)

## 2019-10-24 MED ORDER — LEVOTHYROXINE SODIUM 150 MCG PO TABS: ORAL_TABLET | ORAL | 1 refills | Status: DC

## 2019-10-24 NOTE — Assessment & Plan Note (Addendum)
Chronic cold intolerance, no change Weight gain noted Wt Readings from Last 3 Encounters:  10/24/19 284 lb 3.2 oz (128.9 kg)  11/30/18 252 lb (114.3 kg)  09/13/17 252 lb 6.4 oz (114.5 kg)   Stable TSH and T4 Maintain current dose, refill sent Repeat in 65month

## 2019-10-24 NOTE — Progress Notes (Addendum)
Subjective:  Patient ID: Gloria Flynn, female    DOB: 02-21-1970  Age: 49 y.o. MRN: [id number]  CC: Establish Care (TOC-[place]/ Thyroid-Pt states she will need refill on medication soon and wanted to come in for blood work before her refill. )  HPI  Severe obesity (BMI >= 40) Admits to diet indiscretion and lack of exercise in last 1year. All her meals are takeout due to convenience and lack of motivation. She denies any depression. She states she knows what changes need to be made to improve BP control, decrease LDL, and promote weight loss. She is aware of the possible complications from inactivity, unhealthy diet, and large meal portions. She intends to implement lifestyle changes. She declined referral to weight loss clinic at this time. F/up in 52month  Hypothyroidism Chronic cold intolerance, no change Weight gain noted Wt Readings from Last 3 Encounters:  10/24/19 284 lb 3.2 oz (128.9 kg)  11/30/18 252 lb (114.3 kg)  09/13/17 252 lb 6.4 oz (114.5 kg)   Stable TSH and T4 Maintain current dose, refill sent Repeat in 634month HTN (hypertension) Stable BP D/c HCTZ, stating she does not want to take additional medication unless needed BP Readings from Last 3 Encounters:  10/24/19 140/88  11/30/18 132/88  09/13/17 134/88   She is committed to making lifestyle changes to maintain BP<140/80 F/up in 45m90monthBP Readings from Last 3 Encounters:  10/24/19 140/88  11/30/18 132/88  09/13/17 134/88   Reviewed past Medical, Social and Family history today.  Outpatient Medications Prior to Visit  Medication Sig Dispense Refill  . Vitamin D, Ergocalciferol, (DRISDOL) 1.25 MG (50000 UNIT) CAPS capsule TAKE 1 CAPSULE ONCE EVERY 7 DAYS 12 capsule 0  . levothyroxine (SYNTHROID) 150 MCG tablet TAKE 1 TABLET BY MOUTH EVERY DAY BEFORE BREAKFAST 90 tablet 3  . promethazine-dextromethorphan (PROMETHAZINE-DM) 6.25-15 MG/5ML syrup Take 5 mLs by mouth 4 (four) times daily as needed. 118  mL 0   No facility-administered medications prior to visit.    ROS See HPI  Objective:  BP 140/88 (BP Location: Left Arm, Patient Position: Sitting, Cuff Size: Large)   Pulse 76   Temp (!) 97.3 F (36.3 C) (Temporal)   Ht 5' 4.25" (1.632 m)   Wt 284 lb 3.2 oz (128.9 kg)   SpO2 100%   BMI 48.40 kg/m   Physical Exam Vitals reviewed.  Constitutional:      Appearance: She is obese.  Neck:     Thyroid: No thyroid mass.  Cardiovascular:     Rate and Rhythm: Normal rate and regular rhythm.     Pulses: Normal pulses.     Heart sounds: Normal heart sounds.  Pulmonary:     Effort: Pulmonary effort is normal.     Breath sounds: Normal breath sounds.  Musculoskeletal:     Cervical back: Normal range of motion and neck supple.     Right lower leg: Edema present.     Left lower leg: Edema present.  Lymphadenopathy:     Cervical: No cervical adenopathy.  Neurological:     Mental Status: She is alert and oriented to person, place, and time.  Psychiatric:        Mood and Affect: Mood normal.        Behavior: Behavior normal.        Thought Content: Thought content normal.    Assessment & Plan:  This visit occurred during the SARS-CoV-2 public health emergency.  Safety protocols were in place, including screening questions  prior to the visit, additional usage of staff PPE, and extensive cleaning of exam room while observing appropriate contact time as indicated for disinfecting solutions.   Naoko was seen today for establish care.  Diagnoses and all orders for this visit:  Postoperative hypothyroidism -     T4, free -     TSH -     levothyroxine (SYNTHROID) 150 MCG tablet; TAKE 1 TABLET BY MOUTH EVERY DAY BEFORE BREAKFAST  Vitamin D deficiency -     Vitamin D 1,25 dihydroxy  Primary hypertension  Morbid obesity ([place])    Problem List Items Addressed This Visit      Cardiovascular and Mediastinum   HTN (hypertension)    Stable BP D/c HCTZ, stating she does not  want to take additional medication unless needed BP Readings from Last 3 Encounters:  10/24/19 140/88  11/30/18 132/88  09/13/17 134/88   She is committed to making lifestyle changes to maintain BP<140/80 F/up in 60month        Endocrine   Hypothyroidism - Primary    Chronic cold intolerance, no change Weight gain noted Wt Readings from Last 3 Encounters:  10/24/19 284 lb 3.2 oz (128.9 kg)  11/30/18 252 lb (114.3 kg)  09/13/17 252 lb 6.4 oz (114.5 kg)   Stable TSH and T4 Maintain current dose, refill sent Repeat in 658month     Relevant Medications   levothyroxine (SYNTHROID) 150 MCG tablet   Other Relevant Orders   T4, free (Completed)   TSH (Completed)     Other   Severe obesity (BMI >= 40) ([facility])    Admits to diet indiscretion and lack of exercise in last 1year. All her meals are takeout due to convenience and lack of motivation. She denies any depression. She states she knows what changes need to be made to improve BP control, decrease LDL, and promote weight loss. She is aware of the possible complications from inactivity, unhealthy diet, and large meal portions. She intends to implement lifestyle changes. She declined referral to weight loss clinic at this time. F/up in 98m12month    Vitamin D deficiency   Relevant Orders   Vitamin D 1,25 dihydroxy      Follow-up: Return in about 6 months (around 04/23/2020) for CPE (fatsing).  ChaWilfred LacyP

## 2019-10-24 NOTE — Patient Instructions (Addendum)
Go to lab for blood draw. Maintain DASH diet and regular exercise.   DASH Eating Plan DASH stands for "Dietary Approaches to Stop Hypertension." The DASH eating plan is a healthy eating plan that has been shown to reduce high blood pressure (hypertension). It may also reduce your risk for type 2 diabetes, heart disease, and stroke. The DASH eating plan may also help with weight loss. What are tips for following this plan?  General guidelines  Avoid eating more than 2,300 mg (milligrams) of salt (sodium) a day. If you have hypertension, you may need to reduce your sodium intake to 1,500 mg a day.  Limit alcohol intake to no more than 1 drink a day for nonpregnant women and 2 drinks a day for men. One drink equals 12 oz of beer, 5 oz of wine, or 1 oz of hard liquor.  Work with your health care provider to maintain a healthy body weight or to lose weight. Ask what an ideal weight is for you.  Get at least 30 minutes of exercise that causes your heart to beat faster (aerobic exercise) most days of the week. Activities may include walking, swimming, or biking.  Work with your health care provider or diet and nutrition specialist (dietitian) to adjust your eating plan to your individual calorie needs. Reading food labels   Check food labels for the amount of sodium per serving. Choose foods with less than 5 percent of the Daily Value of sodium. Generally, foods with less than 300 mg of sodium per serving fit into this eating plan.  To find whole grains, look for the word "whole" as the first word in the ingredient list. Shopping  Buy products labeled as "low-sodium" or "no salt added."  Buy fresh foods. Avoid canned foods and premade or frozen meals. Cooking  Avoid adding salt when cooking. Use salt-free seasonings or herbs instead of table salt or sea salt. Check with your health care provider or pharmacist before using salt substitutes.  Do not fry foods. Cook foods using healthy  methods such as baking, boiling, grilling, and broiling instead.  Cook with heart-healthy oils, such as olive, canola, soybean, or sunflower oil. Meal planning  Eat a balanced diet that includes: ? 5 or more servings of fruits and vegetables each day. At each meal, try to fill half of your plate with fruits and vegetables. ? Up to 6-8 servings of whole grains each day. ? Less than 6 oz of lean meat, poultry, or fish each day. A 3-oz serving of meat is about the same size as a deck of cards. One egg equals 1 oz. ? 2 servings of low-fat dairy each day. ? A serving of nuts, seeds, or beans 5 times each week. ? Heart-healthy fats. Healthy fats called Omega-3 fatty acids are found in foods such as flaxseeds and coldwater fish, like sardines, salmon, and mackerel.  Limit how much you eat of the following: ? Canned or prepackaged foods. ? Food that is high in trans fat, such as fried foods. ? Food that is high in saturated fat, such as fatty meat. ? Sweets, desserts, sugary drinks, and other foods with added sugar. ? Full-fat dairy products.  Do not salt foods before eating.  Try to eat at least 2 vegetarian meals each week.  Eat more home-cooked food and less restaurant, buffet, and fast food.  When eating at a restaurant, ask that your food be prepared with less salt or no salt, if possible. What foods are recommended?  The items listed may not be a complete list. Talk with your dietitian about what dietary choices are best for you. Grains Whole-grain or whole-wheat bread. Whole-grain or whole-wheat pasta. Brown rice. [place]. Bulgur. Whole-grain and low-sodium cereals. Pita bread. Low-fat, low-sodium crackers. Whole-wheat flour tortillas. Vegetables Fresh or frozen vegetables (raw, steamed, roasted, or grilled). Low-sodium or reduced-sodium tomato and vegetable juice. Low-sodium or reduced-sodium tomato sauce and tomato paste. Low-sodium or reduced-sodium canned  vegetables. Fruits All fresh, dried, or frozen fruit. Canned fruit in natural juice (without added sugar). Meat and other protein foods Skinless chicken or [place]. Ground chicken or [place]. Pork with fat trimmed off. Fish and seafood. Egg whites. Dried beans, peas, or lentils. Unsalted nuts, nut butters, and seeds. Unsalted canned beans. Lean cuts of beef with fat trimmed off. Low-sodium, lean deli meat. Dairy Low-fat (1%) or fat-free (skim) milk. Fat-free, low-fat, or reduced-fat cheeses. Nonfat, low-sodium ricotta or cottage cheese. Low-fat or nonfat yogurt. Low-fat, low-sodium cheese. Fats and oils Soft margarine without trans fats. Vegetable oil. Low-fat, reduced-fat, or light mayonnaise and salad dressings (reduced-sodium). Canola, safflower, olive, soybean, and sunflower oils. Avocado. Seasoning and other foods Herbs. Spices. Seasoning mixes without salt. Unsalted popcorn and pretzels. Fat-free sweets. What foods are not recommended? The items listed may not be a complete list. Talk with your dietitian about what dietary choices are best for you. Grains Baked goods made with fat, such as croissants, muffins, or some breads. Dry pasta or rice meal packs. Vegetables Creamed or fried vegetables. Vegetables in a cheese sauce. Regular canned vegetables (not low-sodium or reduced-sodium). Regular canned tomato sauce and paste (not low-sodium or reduced-sodium). Regular tomato and vegetable juice (not low-sodium or reduced-sodium). Angie Fava. Olives. Fruits Canned fruit in a light or heavy syrup. Fried fruit. Fruit in cream or butter sauce. Meat and other protein foods Fatty cuts of meat. Ribs. Fried meat. Berniece Salines. Sausage. Bologna and other processed lunch meats. Salami. Fatback. Hotdogs. Bratwurst. Salted nuts and seeds. Canned beans with added salt. Canned or smoked fish. Whole eggs or egg yolks. Chicken or [place] with skin. Dairy Whole or 2% milk, cream, and half-and-half. Whole or full-fat  cream cheese. Whole-fat or sweetened yogurt. Full-fat cheese. Nondairy creamers. Whipped toppings. Processed cheese and cheese spreads. Fats and oils Butter. Stick margarine. Lard. Shortening. Ghee. Bacon fat. Tropical oils, such as coconut, palm kernel, or palm oil. Seasoning and other foods Salted popcorn and pretzels. Onion salt, garlic salt, seasoned salt, table salt, and sea salt. Worcestershire sauce. Tartar sauce. Barbecue sauce. Teriyaki sauce. Soy sauce, including reduced-sodium. Steak sauce. Canned and packaged gravies. Fish sauce. Oyster sauce. Cocktail sauce. Horseradish that you find on the shelf. Ketchup. Mustard. Meat flavorings and tenderizers. Bouillon cubes. Hot sauce and Tabasco sauce. Premade or packaged marinades. Premade or packaged taco seasonings. Relishes. Regular salad dressings. Where to find more information:  National Heart, Lung, and [place]: https://wilson-eaton.com/  American Heart Association: www.heart.org Summary  The DASH eating plan is a healthy eating plan that has been shown to reduce high blood pressure (hypertension). It may also reduce your risk for type 2 diabetes, heart disease, and stroke.  With the DASH eating plan, you should limit salt (sodium) intake to 2,300 mg a day. If you have hypertension, you may need to reduce your sodium intake to 1,500 mg a day.  When on the DASH eating plan, aim to eat more fresh fruits and vegetables, whole grains, lean proteins, low-fat dairy, and heart-healthy fats.  Work with your health care  provider or diet and nutrition specialist (dietitian) to adjust your eating plan to your individual calorie needs. This information is not intended to replace advice given to you by your health care provider. Make sure you discuss any questions you have with your health care provider. Document Revised: 12/04/2016 Document Reviewed: 12/16/2015 Elsevier Patient Education  2020 [employer].

## 2019-10-24 NOTE — Addendum Note (Signed)
Addended by: Wilfred Lacy L on: 10/24/2019 03:14 PM   Modules accepted: Orders

## 2019-10-24 NOTE — Assessment & Plan Note (Signed)
Admits to diet indiscretion and lack of exercise in last 1year. All her meals are takeout due to convenience and lack of motivation. She denies any depression. She states she knows what changes need to be made to improve BP control, decrease LDL, and promote weight loss. She is aware of the possible complications from inactivity, unhealthy diet, and large meal portions. She intends to implement lifestyle changes. She declined referral to weight loss clinic at this time. F/up in 43month

## 2019-10-24 NOTE — Assessment & Plan Note (Signed)
Stable BP D/c HCTZ, stating she does not want to take additional medication unless needed BP Readings from Last 3 Encounters:  10/24/19 140/88  11/30/18 132/88  09/13/17 134/88   She is committed to making lifestyle changes to maintain BP<140/80 F/up in 49month

## 2019-10-27 LAB — VITAMIN D 1,25 DIHYDROXY
Vitamin D 1, 25 (OH)2 Total: 17 pg/mL — ABNORMAL LOW (ref 18–72)
Vitamin D2 1, 25 (OH)2: <8 pg/mL
Vitamin D3 1, 25 (OH)2: 17 pg/mL

## 2019-10-30 MED ORDER — VITAMIN D (ERGOCALCIFEROL) 1.25 MG (50000 UNIT) PO CAPS: 50000.0000 [IU] | ORAL_CAPSULE | ORAL | 0 refills | Status: DC

## 2019-10-30 NOTE — Addendum Note (Signed)
Addended by: Wilfred Lacy L on: 10/30/2019 01:25 PM   Modules accepted: Orders

## 2020-04-12 ENCOUNTER — Other Ambulatory Visit: Payer: Self-pay | Admitting: Nurse Practitioner

## 2020-04-12 DIAGNOSIS — E890 Postprocedural hypothyroidism: Secondary | ICD-10-CM

## 2020-05-13 IMAGING — MG DIGITAL SCREENING BILATERAL MAMMOGRAM WITH TOMO AND CAD
8 of 14 series · 8 of 40 positions shown · non-contrast
Comparison: Previous exam(s).

CLINICAL DATA: Screening.

EXAM:
DIGITAL SCREENING BILATERAL MAMMOGRAM WITH TOMO AND CAD

[L CC synth-2D]
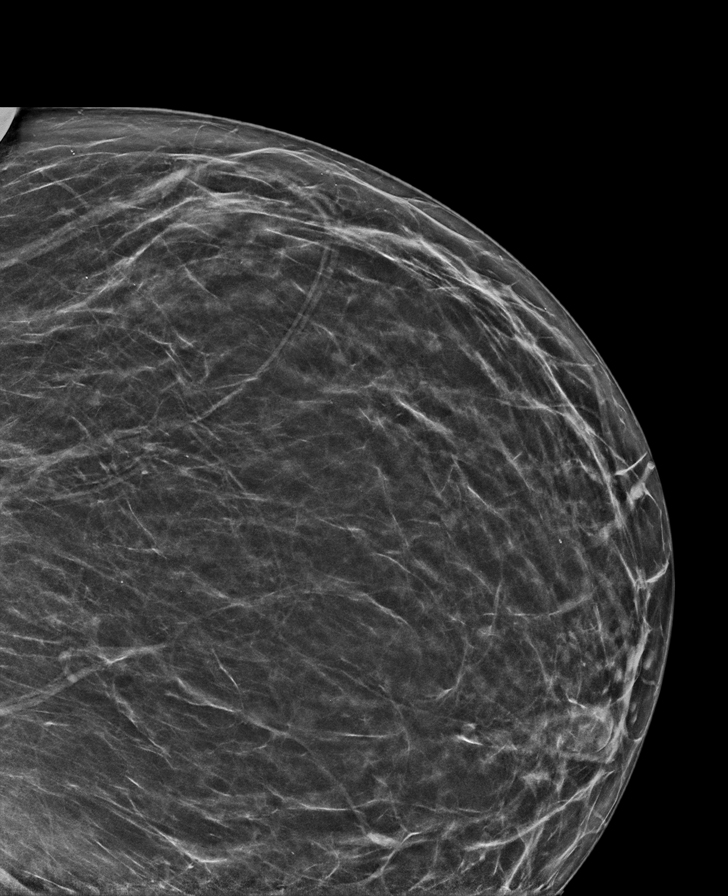

[L MLO synth-2D (1 of 2)]
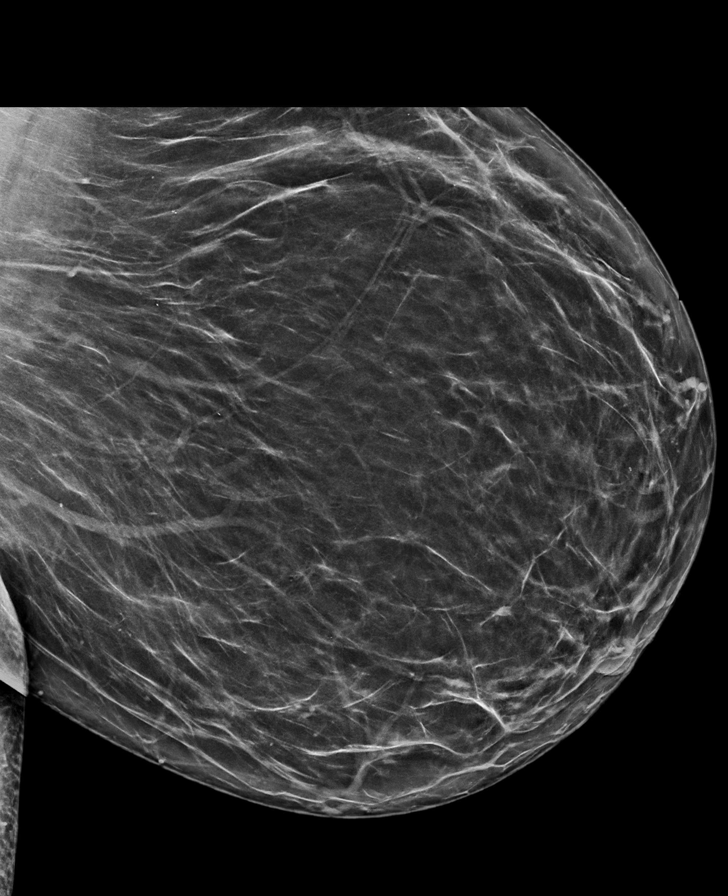

[R CC synth-2D]
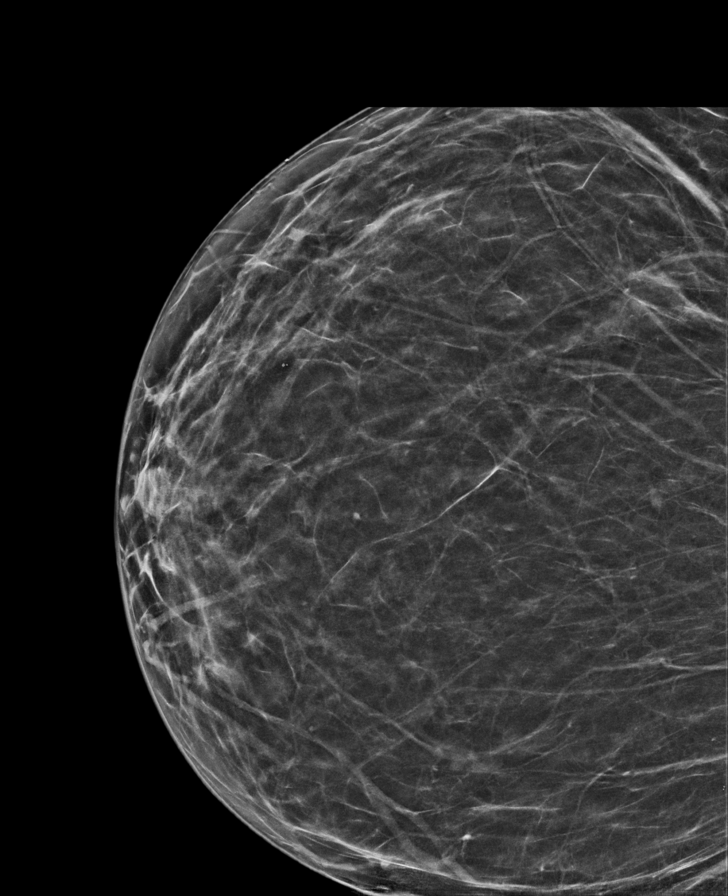

[R MLO synth-2D (1 of 2)]
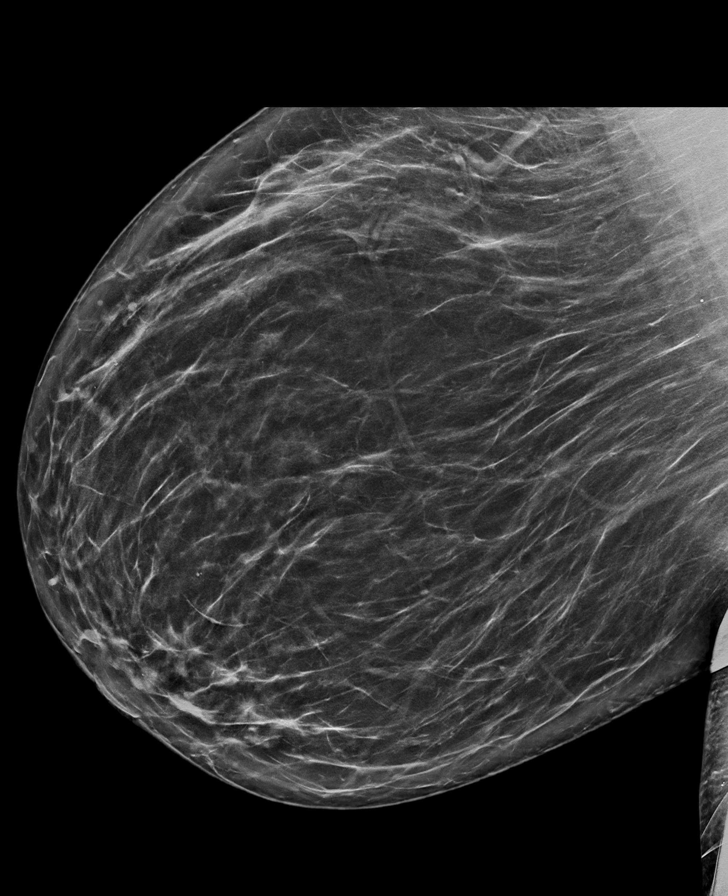

[L MLO synth-2D (2 of 2)]
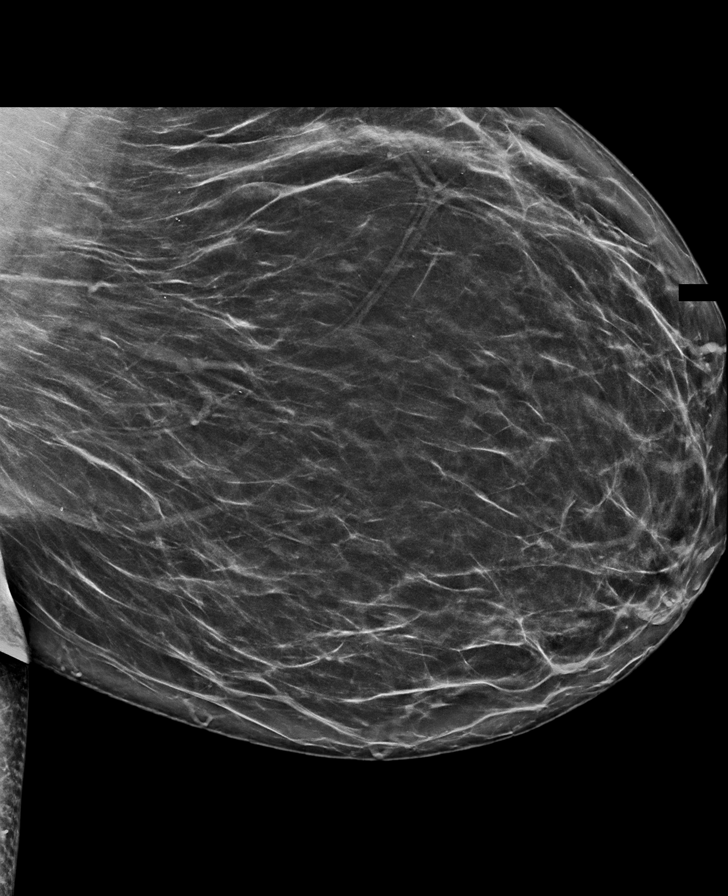

[R MLO synth-2D (2 of 2)]
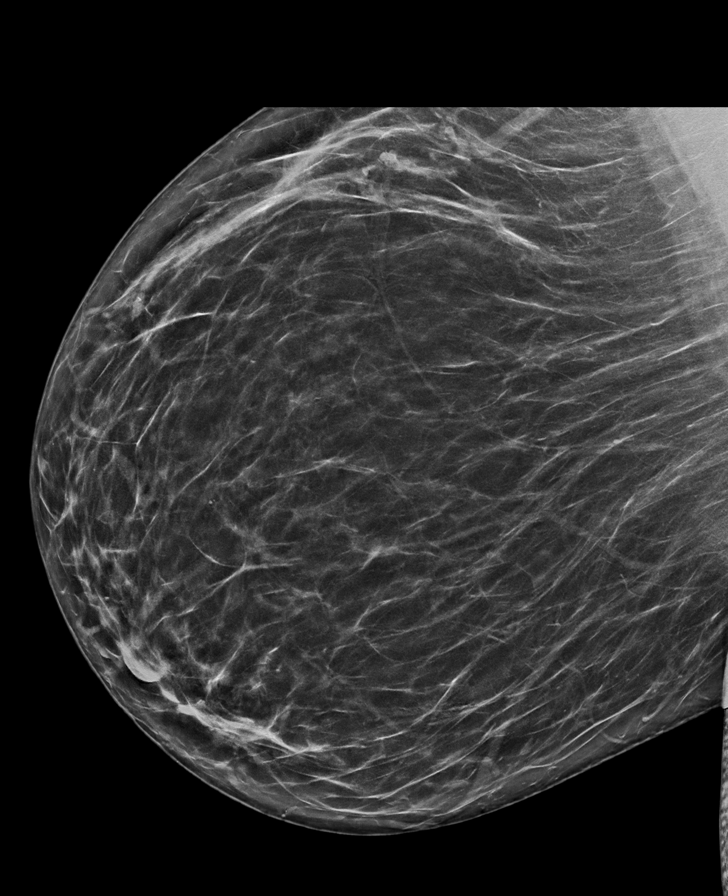

[L CV synth-2D]
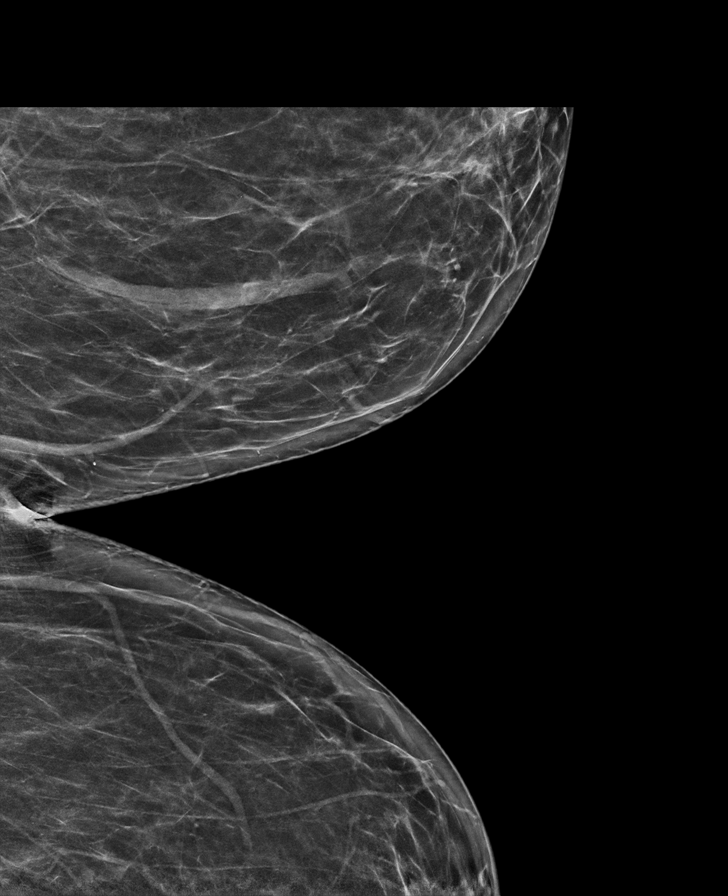

[L MLO tomo · tomo slice 43/85.0]
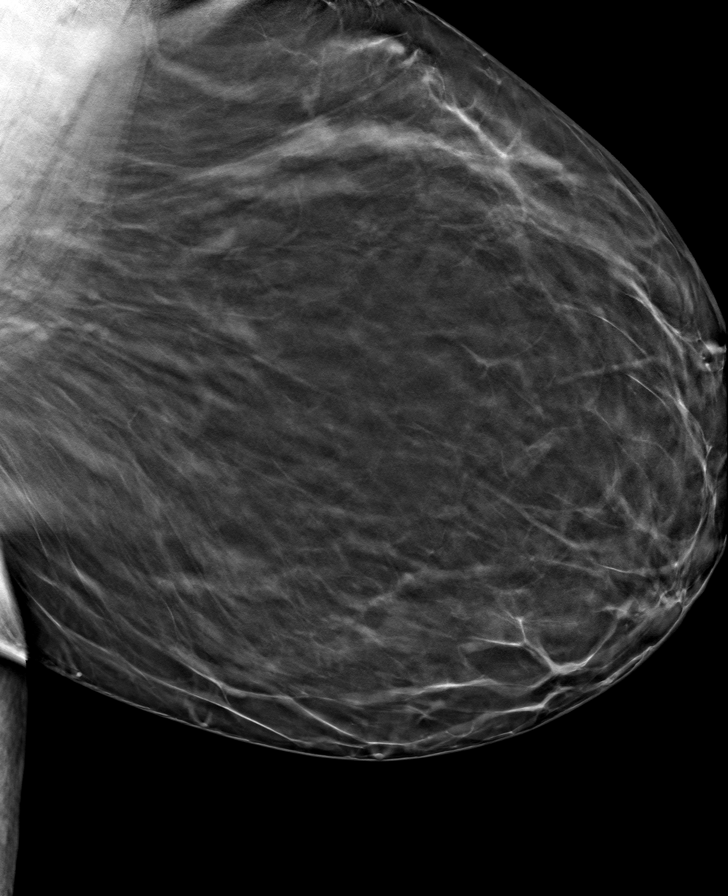

[8 of 40 positions shown; findings below may reference images not displayed]

ACR Breast Density Category b: There are scattered areas of
fibroglandular density.
FINDINGS: There are no findings suspicious for malignancy. Images were
processed with CAD.
IMPRESSION: No mammographic evidence of malignancy. A result letter of this
screening mammogram will be mailed directly to the patient.

RECOMMENDATION:
Screening mammogram in one year. (Code:[id number])

BI-RADS CATEGORY  1: Negative.

## 2020-07-12 ENCOUNTER — Other Ambulatory Visit: Payer: Self-pay | Admitting: Nurse Practitioner

## 2020-07-12 DIAGNOSIS — E890 Postprocedural hypothyroidism: Secondary | ICD-10-CM

## 2020-07-15 NOTE — Telephone Encounter (Signed)
Last ov 10/24/2019 Pt needs appt for further refills Next appt 08/16/2020 @8 :00am Refill levothyroxine 30 day

## 2020-08-14 ENCOUNTER — Telehealth: Payer: Self-pay | Admitting: Nurse Practitioner

## 2020-08-14 NOTE — Telephone Encounter (Signed)
Pt wants to know if she can switch her appt Friday (8/12) to virtual..Marland KitchenMarland Kitchen

## 2020-08-16 ENCOUNTER — Telehealth: Payer: Self-pay | Admitting: Nurse Practitioner

## 2020-08-16 ENCOUNTER — Encounter: Payer: Self-pay | Admitting: Nurse Practitioner

## 2020-08-16 ENCOUNTER — Ambulatory Visit (INDEPENDENT_AMBULATORY_CARE_PROVIDER_SITE_OTHER): Payer: BC Managed Care – PPO | Admitting: Nurse Practitioner

## 2020-08-16 ENCOUNTER — Other Ambulatory Visit: Payer: Self-pay

## 2020-08-16 ENCOUNTER — Ambulatory Visit: Payer: BC Managed Care – PPO | Admitting: Nurse Practitioner

## 2020-08-16 VITALS — BP 130/80 | HR 86 | Temp 97.7°F | Ht 64.25 in | Wt 262.8 lb

## 2020-08-16 DIAGNOSIS — E559 Vitamin D deficiency, unspecified: Secondary | ICD-10-CM

## 2020-08-16 DIAGNOSIS — Z1211 Encounter for screening for malignant neoplasm of colon: Secondary | ICD-10-CM

## 2020-08-16 DIAGNOSIS — I10 Essential (primary) hypertension: Secondary | ICD-10-CM

## 2020-08-16 DIAGNOSIS — E890 Postprocedural hypothyroidism: Secondary | ICD-10-CM | POA: Diagnosis not present

## 2020-08-16 MED ORDER — VITAMIN D (ERGOCALCIFEROL) 1.25 MG (50000 UNIT) PO CAPS: 50000.0000 [IU] | ORAL_CAPSULE | ORAL | 0 refills | Status: DC

## 2020-08-16 MED ORDER — CALCIUM CARBONATE 600 MG PO TABS: 600.0000 mg | ORAL_TABLET | Freq: Two times a day (BID) | ORAL | 3 refills | Status: DC

## 2020-08-16 MED ORDER — LEVOTHYROXINE SODIUM 150 MCG PO TABS: 150.0000 ug | ORAL_TABLET | Freq: Every day | ORAL | 0 refills | Status: DC

## 2020-08-16 NOTE — Telephone Encounter (Signed)
What is the name of the medication? levothyroxine (SYNTHROID) 150 MCG tablet [[id number]  Have you contacted your pharmacy to request a refill? She was seen today 08/16/20 and forgot to tell Baldo Ash she is wanting a 90 day supply Of her synthroid.  Which pharmacy would you like this sent to? Pharmacy  [id number] [place], [place] [id number] Phone:  [phone] Fax:  [phone] DEA #:  [id number]    Patient notified that their request is being sent to the clinical staff for review and that they should receive a call once it is complete. If they do not receive a call within 72 hours they can check with their pharmacy or our office.

## 2020-08-16 NOTE — Progress Notes (Signed)
Subjective:  Patient ID: Gloria Flynn, female    DOB: 1970/03/10  Age: 50 y.o. MRN: [id number]  CC: Follow-up (Pt states Gloria Flynn is due for refills on Thyroid medication/Pt would like to do colorguard oppose to colonoscopy. )  HPI  Vitamin D deficiency Repeat vit. D level: low Refill sent  Hypothyroidism Asymtomatic, weight loss noted but that was intentional Repeat TSH and T4: low tsh and normal T4 Maintain current dose at 170mg Repeat labs in 6weeks  HTN (hypertension) BP at goal with DASH diet BP Readings from Last 3 Encounters:  08/16/20 130/80  10/24/19 140/88  11/30/18 132/88   Check BMP, TSh and t4 Wt Readings from Last 3 Encounters:  08/16/20 262 lb 12.8 oz (119.2 kg)  10/24/19 284 lb 3.2 oz (128.9 kg)  11/30/18 252 lb (114.3 kg)     Reviewed past Medical, Social and Family history today.  Outpatient Medications Prior to Visit  Medication Sig Dispense Refill   levothyroxine (SYNTHROID) 150 MCG tablet TAKE 1 TABLET BY MOUTH EVERY DAY BEFORE BREAKFAST**NEEDS APPOINTMENT BEFORE ANYMORE REFILLS GIVEN** 30 tablet 0   Vitamin D, Ergocalciferol, (DRISDOL) 1.25 MG (50000 UNIT) CAPS capsule Take 1 capsule (50,000 Units total) by mouth every 7 (seven) days. 12 capsule 0   No facility-administered medications prior to visit.    ROS See HPI  Objective:  BP 130/80 (BP Location: Left Arm, Patient Position: Sitting, Cuff Size: Large)   Pulse 86   Temp 97.7 F (36.5 C) (Temporal)   Ht 5' 4.25" (1.632 m)   Wt 262 lb 12.8 oz (119.2 kg)   SpO2 100%   BMI 44.76 kg/m   Physical Exam Vitals reviewed.  Constitutional:      Appearance: Gloria Flynn is obese.  Cardiovascular:     Rate and Rhythm: Normal rate.     Pulses: Normal pulses.  Pulmonary:     Effort: Pulmonary effort is normal.  Musculoskeletal:     Right lower leg: No edema.     Left lower leg: No edema.  Neurological:     Mental Status: Gloria Flynn is alert and oriented to person, place, and time.    Assessment &  Plan:  This visit occurred during the SARS-CoV-2 public health emergency.  Safety protocols were in place, including screening questions prior to the visit, additional usage of staff PPE, and extensive cleaning of exam room while observing appropriate contact time as indicated for disinfecting solutions.   AMykeriawas seen today for follow-up.  Diagnoses and all orders for this visit:  Primary hypertension -     Cancel: Basic metabolic panel -     Basic metabolic panel  Postoperative hypothyroidism -     Discontinue: levothyroxine (SYNTHROID) 150 MCG tablet; Take 1 tablet (150 mcg total) by mouth daily before breakfast. -     Cancel: T4, free -     Cancel: TSH -     TSH -     T4, free -     T4, free; Future -     TSH; Future -     levothyroxine (SYNTHROID) 150 MCG tablet; Take 1 tablet (150 mcg total) by mouth daily before breakfast.  Vitamin D deficiency -     Vitamin D, Ergocalciferol, (DRISDOL) 1.25 MG (50000 UNIT) CAPS capsule; Take 1 capsule (50,000 Units total) by mouth every 7 (seven) days. -     Cancel: VITAMIN D 25 Hydroxy (Vit-D Deficiency, Fractures) -     calcium carbonate (CALCIUM 600) 600 MG TABS tablet; Take 1  tablet (600 mg total) by mouth 2 (two) times daily with a meal. -     VITAMIN D 25 Hydroxy (Vit-D Deficiency, Fractures)  Colon cancer screening -     Cologuard   Problem List Items Addressed This Visit       Cardiovascular and Mediastinum   HTN (hypertension) - Primary    BP at goal with DASH diet BP Readings from Last 3 Encounters:  08/16/20 130/80  10/24/19 140/88  11/30/18 132/88   Check BMP, TSh and t4      Relevant Orders   Basic metabolic panel (Completed)     Endocrine   Hypothyroidism    Asymtomatic, weight loss noted but that was intentional Repeat TSH and T4: low tsh and normal T4 Maintain current dose at 193mg Repeat labs in 6weeks      Relevant Medications   levothyroxine (SYNTHROID) 150 MCG tablet   Other Relevant Orders    TSH (Completed)   T4, free (Completed)   T4, free   TSH     Other   Vitamin D deficiency    Repeat vit. D level: low Refill sent      Relevant Medications   Vitamin D, Ergocalciferol, (DRISDOL) 1.25 MG (50000 UNIT) CAPS capsule   calcium carbonate (CALCIUM 600) 600 MG TABS tablet   Other Relevant Orders   VITAMIN D 25 Hydroxy (Vit-D Deficiency, Fractures) (Completed)   Other Visit Diagnoses     Colon cancer screening       Relevant Orders   Cologuard       Follow-up: Return in about 6 months (around 02/16/2021) for CPE (fasting).  CWilfred Lacy NP

## 2020-08-16 NOTE — Patient Instructions (Addendum)
Go to lab for blood draw.

## 2020-08-16 NOTE — Assessment & Plan Note (Addendum)
Asymtomatic, weight loss noted but that was intentional Repeat TSH and T4: low tsh and normal T4 Maintain current dose at 196mg Repeat labs in 6weeks

## 2020-08-16 NOTE — Assessment & Plan Note (Signed)
BP at goal with DASH diet BP Readings from Last 3 Encounters:  08/16/20 130/80  10/24/19 140/88  11/30/18 132/88   Check BMP, TSh and t4

## 2020-08-16 NOTE — Assessment & Plan Note (Addendum)
Repeat vit. D level: low Refill sent

## 2020-08-17 ENCOUNTER — Encounter: Payer: Self-pay | Admitting: Nurse Practitioner

## 2020-08-19 MED ORDER — LEVOTHYROXINE SODIUM 150 MCG PO TABS: 150.0000 ug | ORAL_TABLET | Freq: Every day | ORAL | 0 refills | Status: DC

## 2020-08-19 NOTE — Telephone Encounter (Signed)
Pt called concerned with her TSH being flagged, has questions about that and if she should take her Rx. Pt also sent msg.

## 2020-08-21 LAB — TSH: TSH: 0.01 mIU/L — ABNORMAL LOW

## 2020-08-21 LAB — BASIC METABOLIC PANEL
BUN: 8 mg/dL (ref 7–25)
CO2: 26 mmol/L (ref 20–32)
Calcium: 9 mg/dL (ref 8.6–10.2)
Chloride: 106 mmol/L (ref 98–110)
Creat: 0.83 mg/dL (ref 0.50–0.99)
Glucose, Bld: 81 mg/dL (ref 65–99)
Potassium: 4 mmol/L (ref 3.5–5.3)
Sodium: 139 mmol/L (ref 135–146)

## 2020-08-21 LAB — T4, FREE: Free T4: 1.8 ng/dL (ref 0.8–1.8)

## 2020-08-21 LAB — VITAMIN D 25 HYDROXY (VIT D DEFICIENCY, FRACTURES): Vit D, 25-Hydroxy: 37 ng/mL (ref 30–100)

## 2020-08-25 DIAGNOSIS — Z1211 Encounter for screening for malignant neoplasm of colon: Secondary | ICD-10-CM | POA: Diagnosis not present

## 2020-09-02 LAB — COLOGUARD: Cologuard: NEGATIVE

## 2020-09-04 ENCOUNTER — Telehealth: Payer: Self-pay

## 2020-09-04 NOTE — Telephone Encounter (Signed)
Called and spoke to patient regarding lab results for 08/20/2020

## 2020-09-06 ENCOUNTER — Other Ambulatory Visit: Payer: Self-pay | Admitting: Nurse Practitioner

## 2020-09-06 DIAGNOSIS — E890 Postprocedural hypothyroidism: Secondary | ICD-10-CM

## 2020-11-02 ENCOUNTER — Other Ambulatory Visit: Payer: Self-pay | Admitting: Nurse Practitioner

## 2020-11-02 DIAGNOSIS — E559 Vitamin D deficiency, unspecified: Secondary | ICD-10-CM

## 2020-11-06 ENCOUNTER — Telehealth: Payer: Self-pay | Admitting: Nurse Practitioner

## 2020-11-06 DIAGNOSIS — E559 Vitamin D deficiency, unspecified: Secondary | ICD-10-CM

## 2020-11-06 MED ORDER — VITAMIN D (ERGOCALCIFEROL) 1.25 MG (50000 UNIT) PO CAPS: 50000.0000 [IU] | ORAL_CAPSULE | ORAL | 0 refills | Status: DC

## 2020-11-06 NOTE — Telephone Encounter (Signed)
Chart supports Rx Last seen 08/16/20 Due to follow up on or around 02/16/21

## 2020-11-06 NOTE — Telephone Encounter (Signed)
What is the name of the medication? Vitamin D, Gloria Flynn, (DRISDOL) 1.25 MG (50000 UNIT) CAPS capsule [[id number]]   Have you contacted your pharmacy to request a refill? Pt got a call from her pharmacy saying-they are needing a new script for this med..  Which pharmacy would you like this sent to? [id number] [address] Phone:  [phone] Fax:  [phone] DEA #:  [id number]  Patient notified that their request is being sent to the clinical staff for review and that they should receive a call once it is complete. If they do not receive a call within 72 hours they can check with their pharmacy or our office.

## 2020-12-04 NOTE — Telephone Encounter (Signed)
Duplicate

## 2021-01-20 DIAGNOSIS — M9903 Segmental and somatic dysfunction of lumbar region: Secondary | ICD-10-CM | POA: Diagnosis not present

## 2021-01-20 DIAGNOSIS — M9904 Segmental and somatic dysfunction of sacral region: Secondary | ICD-10-CM | POA: Diagnosis not present

## 2021-01-20 DIAGNOSIS — M9905 Segmental and somatic dysfunction of pelvic region: Secondary | ICD-10-CM | POA: Diagnosis not present

## 2021-01-20 DIAGNOSIS — M5442 Lumbago with sciatica, left side: Secondary | ICD-10-CM | POA: Diagnosis not present

## 2021-02-01 ENCOUNTER — Other Ambulatory Visit: Payer: Self-pay | Admitting: Nurse Practitioner

## 2021-02-01 DIAGNOSIS — E890 Postprocedural hypothyroidism: Secondary | ICD-10-CM

## 2021-03-24 ENCOUNTER — Telehealth: Payer: Self-pay | Admitting: Nurse Practitioner

## 2021-03-24 NOTE — Telephone Encounter (Signed)
Pt had appt on 3/29 at 1:20 she is stating she does an OV just labs. ?

## 2021-03-24 NOTE — Telephone Encounter (Signed)
Sorry got interrupted she is requesting no OV just labs. She would like a call back this week so she cn schedule the labs. She cancelled her appt on 3/29 ?

## 2021-03-25 NOTE — Telephone Encounter (Signed)
Pt can schedule lab appointment when she returns call.  ?

## 2021-03-26 DIAGNOSIS — L720 Epidermal cyst: Secondary | ICD-10-CM | POA: Diagnosis not present

## 2021-03-26 DIAGNOSIS — M546 Pain in thoracic spine: Secondary | ICD-10-CM | POA: Diagnosis not present

## 2021-04-02 ENCOUNTER — Ambulatory Visit: Payer: BC Managed Care – PPO | Admitting: Nurse Practitioner

## 2021-04-24 ENCOUNTER — Ambulatory Visit: Payer: Self-pay | Admitting: Surgery

## 2021-04-24 DIAGNOSIS — M545 Low back pain, unspecified: Secondary | ICD-10-CM | POA: Diagnosis not present

## 2021-04-24 DIAGNOSIS — G8929 Other chronic pain: Secondary | ICD-10-CM | POA: Diagnosis not present

## 2021-04-24 DIAGNOSIS — D171 Benign lipomatous neoplasm of skin and subcutaneous tissue of trunk: Secondary | ICD-10-CM | POA: Diagnosis not present

## 2021-05-09 ENCOUNTER — Other Ambulatory Visit: Payer: Self-pay | Admitting: Nurse Practitioner

## 2021-05-09 DIAGNOSIS — E890 Postprocedural hypothyroidism: Secondary | ICD-10-CM

## 2021-05-09 NOTE — Telephone Encounter (Signed)
Spoke with patient and she states she is not out of medication but is not able to come in for a appointment due to a hectic schedule. Pt asked for appointment for next Tuesday and I gave her two available options and she declined.  ?

## 2021-06-04 ENCOUNTER — Other Ambulatory Visit: Payer: Self-pay | Admitting: Nurse Practitioner

## 2021-06-04 DIAGNOSIS — E890 Postprocedural hypothyroidism: Secondary | ICD-10-CM

## 2021-06-30 ENCOUNTER — Other Ambulatory Visit: Payer: Self-pay | Admitting: Nurse Practitioner

## 2021-06-30 DIAGNOSIS — E890 Postprocedural hypothyroidism: Secondary | ICD-10-CM

## 2021-08-01 ENCOUNTER — Other Ambulatory Visit: Payer: Self-pay | Admitting: Nurse Practitioner

## 2021-08-01 ENCOUNTER — Telehealth: Payer: Self-pay | Admitting: Nurse Practitioner

## 2021-08-01 DIAGNOSIS — E890 Postprocedural hypothyroidism: Secondary | ICD-10-CM

## 2021-08-01 NOTE — Telephone Encounter (Signed)
Called & spoke w/ pt, appt scheduled for 08/04/21

## 2021-08-01 NOTE — Telephone Encounter (Signed)
Pt is requesting a refill on her thyroid medication and labs. She has not been here since 08/2020 and I suggested an appt because it's near a year. She stated regardless I still need the medication. She wants orders for the labs also. Leianne Brant [phone]

## 2021-08-01 NOTE — Telephone Encounter (Signed)
Left VM. adv pt to call back

## 2021-08-01 NOTE — Telephone Encounter (Signed)
Chart supports Rx Last OV: 08/2020 Next OV:  Not scheduled   30 day supply, pt needs F/u appt for further refills

## 2021-08-04 ENCOUNTER — Ambulatory Visit (INDEPENDENT_AMBULATORY_CARE_PROVIDER_SITE_OTHER): Payer: BC Managed Care – PPO | Admitting: Nurse Practitioner

## 2021-08-04 ENCOUNTER — Encounter: Payer: Self-pay | Admitting: Nurse Practitioner

## 2021-08-04 ENCOUNTER — Ambulatory Visit (INDEPENDENT_AMBULATORY_CARE_PROVIDER_SITE_OTHER): Payer: BC Managed Care – PPO

## 2021-08-04 VITALS — BP 146/98 | HR 65 | Temp 97.1°F | Ht 64.0 in | Wt 273.6 lb

## 2021-08-04 DIAGNOSIS — E890 Postprocedural hypothyroidism: Secondary | ICD-10-CM | POA: Diagnosis not present

## 2021-08-04 DIAGNOSIS — E559 Vitamin D deficiency, unspecified: Secondary | ICD-10-CM

## 2021-08-04 DIAGNOSIS — Z6841 Body Mass Index (BMI) 40.0 and over, adult: Secondary | ICD-10-CM

## 2021-08-04 DIAGNOSIS — M5441 Lumbago with sciatica, right side: Secondary | ICD-10-CM

## 2021-08-04 DIAGNOSIS — I10 Essential (primary) hypertension: Secondary | ICD-10-CM | POA: Diagnosis not present

## 2021-08-04 DIAGNOSIS — G8929 Other chronic pain: Secondary | ICD-10-CM

## 2021-08-04 NOTE — Assessment & Plan Note (Signed)
Repeat Vit D: low Sent 50000iu x 12weeks

## 2021-08-04 NOTE — Assessment & Plan Note (Signed)
Repeat TSH and T4: low TSH and T4 Decrease dose to 134mg Repeat labs in 6-8weeks

## 2021-08-04 NOTE — Patient Instructions (Addendum)
Go to lab. Start daily back exercise Continue aleve or advil as needed for back pain  Back Exercises The following exercises strengthen the muscles that help to support the trunk (torso) and back. They also help to keep the lower back flexible. Doing these exercises can help to prevent or lessen existing low back pain. If you have back pain or discomfort, try doing these exercises 2-3 times each day or as told by your health care provider. As your pain improves, do them once each day, but increase the number of times that you repeat the steps for each exercise (do more repetitions). To prevent the recurrence of back pain, continue to do these exercises once each day or as told by your health care provider. Do exercises exactly as told by your health care provider and adjust them as directed. It is normal to feel mild stretching, pulling, tightness, or discomfort as you do these exercises, but you should stop right away if you feel sudden pain or your pain gets worse. Exercises Single knee to chest Repeat these steps 3-5 times for each leg: Lie on your back on a firm bed or the floor with your legs extended. Bring one knee to your chest. Your other leg should stay extended and in contact with the floor. Hold your knee in place by grabbing your knee or thigh with both hands and hold. Pull on your knee until you feel a gentle stretch in your lower back or buttocks. Hold the stretch for 10-30 seconds. Slowly release and straighten your leg.  Pelvic tilt Repeat these steps 5-10 times: Lie on your back on a firm bed or the floor with your legs extended. Bend your knees so they are pointing toward the ceiling and your feet are flat on the floor. Tighten your lower abdominal muscles to press your lower back against the floor. This motion will tilt your pelvis so your tailbone points up toward the ceiling instead of pointing to your feet or the floor. With gentle tension and even breathing, hold this  position for 5-10 seconds.  Cat-cow Repeat these steps until your lower back becomes more flexible: Get into a hands-and-knees position on a firm bed or the floor. Keep your hands under your shoulders, and keep your knees under your hips. You may place padding under your knees for comfort. Let your head hang down toward your chest. Contract your abdominal muscles and point your tailbone toward the floor so your lower back becomes rounded like the back of a cat. Hold this position for 5 seconds. Slowly lift your head, let your abdominal muscles relax, and point your tailbone up toward the ceiling so your back forms a sagging arch like the back of a cow. Hold this position for 5 seconds.  Press-ups Repeat these steps 5-10 times: Lie on your abdomen (face-down) on a firm bed or the floor. Place your palms near your head, about shoulder-width apart. Keeping your back as relaxed as possible and keeping your hips on the floor, slowly straighten your arms to raise the top half of your body and lift your shoulders. Do not use your back muscles to raise your upper torso. You may adjust the placement of your hands to make yourself more comfortable. Hold this position for 5 seconds while you keep your back relaxed. Slowly return to lying flat on the floor.  Bridges Repeat these steps 10 times: Lie on your back on a firm bed or the floor. Bend your knees so they are  pointing toward the ceiling and your feet are flat on the floor. Your arms should be flat at your sides, next to your body. Tighten your buttocks muscles and lift your buttocks off the floor until your waist is at almost the same height as your knees. You should feel the muscles working in your buttocks and the back of your thighs. If you do not feel these muscles, slide your feet 1-2 inches (2.5-5 cm) farther away from your buttocks. Hold this position for 3-5 seconds. Slowly lower your hips to the starting position, and allow your buttocks  muscles to relax completely. If this exercise is too easy, try doing it with your arms crossed over your chest. Abdominal crunches Repeat these steps 5-10 times: Lie on your back on a firm bed or the floor with your legs extended. Bend your knees so they are pointing toward the ceiling and your feet are flat on the floor. Cross your arms over your chest. Tip your chin slightly toward your chest without bending your neck. Tighten your abdominal muscles and slowly raise your torso high enough to lift your shoulder blades a tiny bit off the floor. Avoid raising your torso higher than that because it can put too much stress on your lower back and does not help to strengthen your abdominal muscles. Slowly return to your starting position.  Back lifts Repeat these steps 5-10 times: Lie on your abdomen (face-down) with your arms at your sides, and rest your forehead on the floor. Tighten the muscles in your legs and your buttocks. Slowly lift your chest off the floor while you keep your hips pressed to the floor. Keep the back of your head in line with the curve in your back. Your eyes should be looking at the floor. Hold this position for 3-5 seconds. Slowly return to your starting position.  Contact a health care provider if: Your back pain or discomfort gets much worse when you do an exercise. Your worsening back pain or discomfort does not lessen within 2 hours after you exercise. If you have any of these problems, stop doing these exercises right away. Do not do them again unless your health care provider says that you can. Get help right away if: You develop sudden, severe back pain. If this happens, stop doing the exercises right away. Do not do them again unless your health care provider says that you can. This information is not intended to replace advice given to you by your health care provider. Make sure you discuss any questions you have with your health care provider. Document  Revised: 06/18/2020 Document Reviewed: 03/06/2020 Elsevier Patient Education  [place].

## 2021-08-04 NOTE — Assessment & Plan Note (Signed)
Elevated BP today Admits to high sodium diet, lack of exercise and work stress. BP Readings from Last 3 Encounters:  08/04/21 (!) 146/98  08/16/20 130/80  10/24/19 140/88   Advised about necessary lifestyle modifications, need to monitor BP at home in Am and send via mychart in 1week

## 2021-08-04 NOTE — Assessment & Plan Note (Signed)
Waxing and waning. Worse in AM. Improves with repositioning and use of OTC NSAID prn No improvement with change of mattress Associated with right side radiculopathy and ABD pain No change in GI/GU function. Denies any back injury.  Get back x-ray today Provided home back exercise Continue use of OTC NSAID or tylenol prn

## 2021-08-04 NOTE — Progress Notes (Unsigned)
Established Patient Visit  Patient: Gloria Flynn   DOB: 07-Apr-1970   51 y.o. Female  MRN: [id number] Visit Date: 08/05/2021  Subjective:    Chief Complaint  Patient presents with   Office Visit    Thyroid F/u  No concerns today    HPI HTN (hypertension) Elevated BP today Admits to high sodium diet, lack of exercise and work stress. BP Readings from Last 3 Encounters:  08/04/21 (!) 146/98  08/16/20 130/80  10/24/19 140/88   Advised about necessary lifestyle modifications, need to monitor BP at home in Am and send via mychart in 1week  Hypothyroidism Repeat TSH and T4: low TSH and T4 Decrease dose to 151mg Repeat labs in 6-8weeks  Vitamin D deficiency Repeat Vit D: low Sent 50000iu x 12weeks  Chronic bilateral low back pain with right-sided sciatica Waxing and waning. Worse in AM. Improves with repositioning and use of OTC NSAID prn No improvement with change of mattress Associated with right side radiculopathy and ABD pain No change in GI/GU function. Denies any back injury.  Get back x-ray today Provided home back exercise Continue use of OTC NSAID or tylenol prn   Reviewed medical, surgical, and social history today  Medications: Outpatient Medications Prior to Visit  Medication Sig   [DISCONTINUED] levothyroxine (SYNTHROID) 150 MCG tablet TAKE 1 TABLET BY MOUTH EVERY DAY BEFORE BREAKFAST   [DISCONTINUED] calcium carbonate (CALCIUM 600) 600 MG TABS tablet Take 1 tablet (600 mg total) by mouth 2 (two) times daily with a meal. (Patient not taking: Reported on 08/04/2021)   [DISCONTINUED] Vitamin D, Ergocalciferol, (DRISDOL) 1.25 MG (50000 UNIT) CAPS capsule Take 1 capsule (50,000 Units total) by mouth every 7 (seven) days. (Patient not taking: Reported on 08/04/2021)   No facility-administered medications prior to visit.   Reviewed past medical and social history.   ROS per HPI above      Objective:  BP (!) 146/98 (BP Location: Right Arm,  Patient Position: Sitting, Cuff Size: Normal)   Pulse 65   Temp (!) 97.1 F (36.2 C) (Temporal)   Ht '5\' 4"'$  (1.626 m)   Wt 273 lb 9.6 oz (124.1 kg)   SpO2 98%   BMI 46.96 kg/m      Physical Exam Constitutional:      Appearance: She is obese.  Cardiovascular:     Rate and Rhythm: Normal rate.     Pulses: Normal pulses.  Pulmonary:     Effort: Pulmonary effort is normal.  Musculoskeletal:        General: Normal range of motion.     Right lower leg: No edema.     Left lower leg: No edema.  Neurological:     Mental Status: She is alert and oriented to person, place, and time.     Results for orders placed or performed in visit on 08/04/21  TSH  Result Value Ref Range   TSH 0.11 (L) 0.35 - 5.50 uIU/mL  T4, free  Result Value Ref Range   Free T4 1.16 0.60 - 1.60 ng/dL  Vitamin D (25 hydroxy)  Result Value Ref Range   VITD 21.99 (L) 30.00 - 100.00 ng/mL  Basic metabolic panel  Result Value Ref Range   Sodium 137 135 - 145 mEq/L   Potassium 4.1 3.5 - 5.1 mEq/L   Chloride 102 96 - 112 mEq/L   CO2 23 19 - 32 mEq/L   Glucose, Bld 70 70 -  99 mg/dL   BUN 10 6 - 23 mg/dL   Creatinine, Ser 0.91 0.40 - 1.20 mg/dL   GFR 74.16 >60.00 mL/min   Calcium 9.1 8.4 - 10.5 mg/dL  CBC  Result Value Ref Range   WBC 4.0 4.0 - 10.5 K/uL   RBC 5.07 3.87 - 5.11 Mil/uL   Platelets 278.0 150.0 - 400.0 K/uL   Hemoglobin 12.7 12.0 - 15.0 g/dL   HCT 39.7 36.0 - 46.0 %   MCV 78.3 78.0 - 100.0 fl   MCHC 32.1 30.0 - 36.0 g/dL   RDW 16.1 (H) 11.5 - 15.5 %  Direct LDL  Result Value Ref Range   Direct LDL 119.0 mg/dL      Assessment & Plan:    Problem List Items Addressed This Visit       Cardiovascular and Mediastinum   HTN (hypertension)    Elevated BP today Admits to high sodium diet, lack of exercise and work stress. BP Readings from Last 3 Encounters:  08/04/21 (!) 146/98  08/16/20 130/80  10/24/19 140/88   Advised about necessary lifestyle modifications, need to monitor BP at  home in Am and send via mychart in 1week      Relevant Orders   Basic metabolic panel (Completed)   CBC (Completed)     Endocrine   Hypothyroidism - Primary    Repeat TSH and T4: low TSH and T4 Decrease dose to 149mg Repeat labs in 6-8weeks      Relevant Medications   levothyroxine (SYNTHROID) 137 MCG tablet   Other Relevant Orders   TSH (Completed)   T4, free (Completed)   T4, free   TSH     Nervous and Auditory   Chronic bilateral low back pain with right-sided sciatica    Waxing and waning. Worse in AM. Improves with repositioning and use of OTC NSAID prn No improvement with change of mattress Associated with right side radiculopathy and ABD pain No change in GI/GU function. Denies any back injury.  Get back x-ray today Provided home back exercise Continue use of OTC NSAID or tylenol prn      Relevant Orders   DG Lumbar Spine Complete (Completed)     Other   Severe obesity (BMI >= 40) (HCC)   Vitamin D deficiency    Repeat Vit D: low Sent 50000iu x 12weeks      Relevant Medications   Vitamin D, Ergocalciferol, (DRISDOL) 1.25 MG (50000 UNIT) CAPS capsule   Other Relevant Orders   Vitamin D (25 hydroxy) (Completed)   VITAMIN D 25 Hydroxy (Vit-D Deficiency, Fractures)   Return in about 3 months (around 11/04/2021) for Hypothyroidism, HTN and chronic back pain.     CWilfred Lacy NP

## 2021-08-05 ENCOUNTER — Telehealth: Payer: Self-pay | Admitting: Nurse Practitioner

## 2021-08-05 ENCOUNTER — Other Ambulatory Visit: Payer: Self-pay | Admitting: Nurse Practitioner

## 2021-08-05 DIAGNOSIS — M545 Low back pain, unspecified: Secondary | ICD-10-CM | POA: Diagnosis not present

## 2021-08-05 DIAGNOSIS — E559 Vitamin D deficiency, unspecified: Secondary | ICD-10-CM

## 2021-08-05 LAB — BASIC METABOLIC PANEL
BUN: 10 mg/dL (ref 6–23)
CO2: 23 mEq/L (ref 19–32)
Calcium: 9.1 mg/dL (ref 8.4–10.5)
Chloride: 102 mEq/L (ref 96–112)
Creatinine, Ser: 0.91 mg/dL (ref 0.40–1.20)
GFR: 74.16 mL/min (ref >60.00)
Glucose, Bld: 70 mg/dL (ref 70–99)
Potassium: 4.1 mEq/L (ref 3.5–5.1)
Sodium: 137 mEq/L (ref 135–145)

## 2021-08-05 LAB — VITAMIN D 25 HYDROXY (VIT D DEFICIENCY, FRACTURES): VITD: 21.99 ng/mL — ABNORMAL LOW (ref 30.00–100.00)

## 2021-08-05 LAB — LDL CHOLESTEROL, DIRECT: Direct LDL: 119 mg/dL

## 2021-08-05 LAB — CBC
HCT: 39.7 % (ref 36.0–46.0)
Hemoglobin: 12.7 g/dL (ref 12.0–15.0)
MCHC: 32.1 g/dL (ref 30.0–36.0)
MCV: 78.3 fl (ref 78.0–100.0)
Platelets: 278 10*3/uL (ref 150.0–400.0)
RBC: 5.07 Mil/uL (ref 3.87–5.11)
RDW: 16.1 % — ABNORMAL HIGH (ref 11.5–15.5)
WBC: 4 10*3/uL (ref 4.0–10.5)

## 2021-08-05 LAB — TSH: TSH: 0.11 u[IU]/mL — ABNORMAL LOW (ref 0.35–5.50)

## 2021-08-05 LAB — T4, FREE: Free T4: 1.16 ng/dL (ref 0.60–1.60)

## 2021-08-05 MED ORDER — VITAMIN D (ERGOCALCIFEROL) 1.25 MG (50000 UNIT) PO CAPS
50000.0000 [IU] | ORAL_CAPSULE | ORAL | 0 refills | Status: DC
Start: 2021-08-05 — End: 2021-12-08

## 2021-08-05 MED ORDER — LEVOTHYROXINE SODIUM 137 MCG PO TABS
137.0000 ug | ORAL_TABLET | Freq: Every day | ORAL | 0 refills | Status: DC
Start: 2021-08-05 — End: 2021-10-31

## 2021-08-05 NOTE — Telephone Encounter (Signed)
Pt is needing a refill on her vitamin D.   [id number] [address] [place] Phone:  [phone] Fax:  [phone] DEA #:  [id number] Pt's # [phone]

## 2021-08-05 NOTE — Telephone Encounter (Signed)
Caller Name: Ethell Blatchford Call back phone #: [phone]  Reason for Call: Asked for a call back to go over test results.

## 2021-08-05 NOTE — Telephone Encounter (Signed)
Called & spoke w/ pt, adv her labs have not been released to me yet, when [facility] releases them, I will call her back to provide her with the results

## 2021-08-05 NOTE — Telephone Encounter (Signed)
New Rx sent in by [facility]

## 2021-08-05 NOTE — Progress Notes (Unsigned)
Initial visit for chronic LBP with rt side sciaticia Denies injury Waxing and waning

## 2021-08-05 NOTE — Addendum Note (Signed)
Addended by: Wilfred Lacy L on: 08/05/2021 04:12 PM   Modules accepted: Orders

## 2021-10-20 ENCOUNTER — Other Ambulatory Visit: Payer: Self-pay | Admitting: Nurse Practitioner

## 2021-10-20 DIAGNOSIS — E559 Vitamin D deficiency, unspecified: Secondary | ICD-10-CM

## 2021-10-20 NOTE — Telephone Encounter (Signed)
Chart supports Rx Last OV: 07/2021 Next OV: not scheduled

## 2021-10-31 ENCOUNTER — Other Ambulatory Visit: Payer: Self-pay | Admitting: Nurse Practitioner

## 2021-10-31 DIAGNOSIS — E890 Postprocedural hypothyroidism: Secondary | ICD-10-CM

## 2021-10-31 NOTE — Telephone Encounter (Signed)
Chart supports Rx Last OV: 07/2021 Next OV: not scheduled, 30 day supply sent, pt needs f/u appt for further refills.

## 2021-11-22 ENCOUNTER — Other Ambulatory Visit: Payer: Self-pay | Admitting: Nurse Practitioner

## 2021-11-22 DIAGNOSIS — E890 Postprocedural hypothyroidism: Secondary | ICD-10-CM

## 2021-11-24 NOTE — Telephone Encounter (Signed)
pt needs f/u appt for further refills.

## 2021-11-28 ENCOUNTER — Other Ambulatory Visit: Payer: Self-pay | Admitting: Nurse Practitioner

## 2021-11-28 DIAGNOSIS — E559 Vitamin D deficiency, unspecified: Secondary | ICD-10-CM

## 2021-11-28 DIAGNOSIS — E890 Postprocedural hypothyroidism: Secondary | ICD-10-CM

## 2021-12-01 ENCOUNTER — Other Ambulatory Visit: Payer: Self-pay | Admitting: Nurse Practitioner

## 2021-12-01 DIAGNOSIS — E890 Postprocedural hypothyroidism: Secondary | ICD-10-CM

## 2021-12-02 ENCOUNTER — Other Ambulatory Visit: Payer: Self-pay | Admitting: Nurse Practitioner

## 2021-12-02 ENCOUNTER — Telehealth: Payer: Self-pay | Admitting: Nurse Practitioner

## 2021-12-02 DIAGNOSIS — E890 Postprocedural hypothyroidism: Secondary | ICD-10-CM

## 2021-12-02 NOTE — Telephone Encounter (Signed)
Pt want you to call her

## 2021-12-02 NOTE — Telephone Encounter (Signed)
Left VM, adv pt to call back to schedule appt to get her med refills.

## 2021-12-08 ENCOUNTER — Encounter: Payer: Self-pay | Admitting: Nurse Practitioner

## 2021-12-08 ENCOUNTER — Ambulatory Visit (INDEPENDENT_AMBULATORY_CARE_PROVIDER_SITE_OTHER): Payer: BC Managed Care – PPO | Admitting: Nurse Practitioner

## 2021-12-08 VITALS — BP 128/82 | HR 74 | Temp 96.8°F | Ht 64.0 in | Wt 288.6 lb

## 2021-12-08 DIAGNOSIS — Z136 Encounter for screening for cardiovascular disorders: Secondary | ICD-10-CM

## 2021-12-08 DIAGNOSIS — E890 Postprocedural hypothyroidism: Secondary | ICD-10-CM

## 2021-12-08 DIAGNOSIS — Z13220 Encounter for screening for lipoid disorders: Secondary | ICD-10-CM

## 2021-12-08 DIAGNOSIS — E559 Vitamin D deficiency, unspecified: Secondary | ICD-10-CM | POA: Diagnosis not present

## 2021-12-08 DIAGNOSIS — I10 Essential (primary) hypertension: Secondary | ICD-10-CM

## 2021-12-08 LAB — LIPID PANEL
Cholesterol: 194 mg/dL (ref 0–200)
HDL: 58.7 mg/dL (ref >39.00)
LDL Cholesterol: 120 mg/dL — ABNORMAL HIGH (ref 0–99)
NonHDL: 134.91
Total CHOL/HDL Ratio: 3
Triglycerides: 73 mg/dL (ref 0.0–149.0)
VLDL: 14.6 mg/dL (ref 0.0–40.0)

## 2021-12-08 LAB — COMPREHENSIVE METABOLIC PANEL
ALT: 23 U/L (ref 0–35)
AST: 18 U/L (ref 0–37)
Albumin: 4.1 g/dL (ref 3.5–5.2)
Alkaline Phosphatase: 67 U/L (ref 39–117)
BUN: 9 mg/dL (ref 6–23)
CO2: 27 mEq/L (ref 19–32)
Calcium: 8.5 mg/dL (ref 8.4–10.5)
Chloride: 103 mEq/L (ref 96–112)
Creatinine, Ser: 0.88 mg/dL (ref 0.40–1.20)
GFR: 77.02 mL/min (ref >60.00)
Glucose, Bld: 90 mg/dL (ref 70–99)
Potassium: 4 mEq/L (ref 3.5–5.1)
Sodium: 137 mEq/L (ref 135–145)
Total Bilirubin: 0.3 mg/dL (ref 0.2–1.2)
Total Protein: 7 g/dL (ref 6.0–8.3)

## 2021-12-08 LAB — T4, FREE: Free T4: 1.04 ng/dL (ref 0.60–1.60)

## 2021-12-08 LAB — TSH: TSH: 0.31 u[IU]/mL — ABNORMAL LOW (ref 0.35–5.50)

## 2021-12-08 LAB — VITAMIN D 25 HYDROXY (VIT D DEFICIENCY, FRACTURES): VITD: 27.73 ng/mL — ABNORMAL LOW (ref 30.00–100.00)

## 2021-12-08 MED ORDER — LEVOTHYROXINE SODIUM 125 MCG PO TABS: 125.0000 ug | ORAL_TABLET | Freq: Every day | ORAL | 0 refills | Status: DC

## 2021-12-08 MED ORDER — VITAMIN D (ERGOCALCIFEROL) 1.25 MG (50000 UNIT) PO CAPS: 50000.0000 [IU] | ORAL_CAPSULE | ORAL | 0 refills | Status: DC

## 2021-12-08 NOTE — Patient Instructions (Signed)
Go to lab

## 2021-12-08 NOTE — Progress Notes (Signed)
Established Patient Visit  Patient: Gloria Flynn   DOB: 12-19-1970   51 y.o. Female  MRN: [id number] Visit Date: 12/08/2021  Subjective:    Chief Complaint  Patient presents with   Office Visit    Med refill, levothyroxine  No concerns    HPI HTN (hypertension) Controlled BP with diet BP Readings from Last 3 Encounters:  12/08/21 128/82  08/04/21 (!) 146/98  08/16/20 130/80    No need for medication at this time Repeat CMP  Hypothyroidism Repeat Tsh and T4: Low tsh but normal T4, decrease levothyroxine to 146mg. Elevated LDL: maintain heart healthy diet Normal renal function Return to lab in 6weeks for repeat TSH and T4   Vitamin D deficiency Repeat vit. D: Low vit. D: sent weekly dosing x 12weeks, then switch to 2000IU daily OTC  Reviewed medical, surgical, and social history today  Medications: Outpatient Medications Prior to Visit  Medication Sig   [DISCONTINUED] Vitamin D, Ergocalciferol, (DRISDOL) 1.25 MG (50000 UNIT) CAPS capsule Take 1 capsule (50,000 Units total) by mouth every 7 (seven) days.   [DISCONTINUED] levothyroxine (SYNTHROID) 137 MCG tablet TAKE 1 TABLET BY MOUTH DAILY BEFORE BREAKFAST. (Patient not taking: Reported on 12/08/2021)   No facility-administered medications prior to visit.   Reviewed past medical and social history.   ROS per HPI above      Objective:  BP 128/82   Pulse 74   Temp (!) 96.8 F (36 C) (Temporal)   Ht '5\' 4"'$  (1.626 m)   Wt 288 lb 9.6 oz (130.9 kg)   SpO2 96%   BMI 49.54 kg/m      Physical Exam Constitutional:      Appearance: She is obese.  Cardiovascular:     Rate and Rhythm: Normal rate.     Pulses: Normal pulses.  Pulmonary:     Effort: Pulmonary effort is normal.  Musculoskeletal:     Right lower leg: No edema.     Left lower leg: No edema.  Neurological:     Mental Status: She is alert and oriented to person, place, and time.     Results for orders placed or performed in visit  on 12/08/21  TSH  Result Value Ref Range   TSH 0.31 (L) 0.35 - 5.50 uIU/mL  T4, free  Result Value Ref Range   Free T4 1.04 0.60 - 1.60 ng/dL  Vitamin D (25 hydroxy)  Result Value Ref Range   VITD 27.73 (L) 30.00 - 100.00 ng/mL  Lipid panel  Result Value Ref Range   Cholesterol 194 0 - 200 mg/dL   Triglycerides 73.0 0.0 - 149.0 mg/dL   HDL 58.70 >39.00 mg/dL   VLDL 14.6 0.0 - 40.0 mg/dL   LDL Cholesterol 120 (H) 0 - 99 mg/dL   Total CHOL/HDL Ratio 3    NonHDL 134.91   Comprehensive metabolic panel  Result Value Ref Range   Sodium 137 135 - 145 mEq/L   Potassium 4.0 3.5 - 5.1 mEq/L   Chloride 103 96 - 112 mEq/L   CO2 27 19 - 32 mEq/L   Glucose, Bld 90 70 - 99 mg/dL   BUN 9 6 - 23 mg/dL   Creatinine, Ser 0.88 0.40 - 1.20 mg/dL   Total Bilirubin 0.3 0.2 - 1.2 mg/dL   Alkaline Phosphatase 67 39 - 117 U/L   AST 18 0 - 37 U/L   ALT 23 0 - 35 U/L  Total Protein 7.0 6.0 - 8.3 g/dL   Albumin 4.1 3.5 - 5.2 g/dL   GFR 77.02 >60.00 mL/min   Calcium 8.5 8.4 - 10.5 mg/dL      Assessment & Plan:    Problem List Items Addressed This Visit       Cardiovascular and Mediastinum   HTN (hypertension)    Controlled BP with diet BP Readings from Last 3 Encounters:  12/08/21 128/82  08/04/21 (!) 146/98  08/16/20 130/80    No need for medication at this time Repeat CMP      Relevant Orders   Comprehensive metabolic panel (Completed)     Endocrine   Hypothyroidism - Primary    Repeat Tsh and T4: Low tsh but normal T4, decrease levothyroxine to 152mg. Elevated LDL: maintain heart healthy diet Normal renal function Return to lab in 6weeks for repeat TSH and T4       Relevant Medications   levothyroxine (SYNTHROID) 125 MCG tablet   Other Relevant Orders   TSH (Completed)   T4, free (Completed)   Comprehensive metabolic panel (Completed)   TSH   T4, free     Other   Vitamin D deficiency    Repeat vit. D: Low vit. D: sent weekly dosing x 12weeks, then switch to 2000IU  daily OTC      Relevant Medications   Vitamin D, Ergocalciferol, (DRISDOL) 1.25 MG (50000 UNIT) CAPS capsule   Other Relevant Orders   Vitamin D (25 hydroxy) (Completed)   Other Visit Diagnoses     Encounter for lipid screening for cardiovascular disease       Relevant Orders   Lipid panel (Completed)      Return in about 3 months (around 03/09/2022) for CPE (fasting, breast and pelvic exam).     CWilfred Lacy NP

## 2021-12-08 NOTE — Assessment & Plan Note (Addendum)
Repeat vit. D: Low vit. D: sent weekly dosing x 12weeks, then switch to 2000IU daily OTC

## 2021-12-08 NOTE — Assessment & Plan Note (Signed)
Controlled BP with diet BP Readings from Last 3 Encounters:  12/08/21 128/82  08/04/21 (!) 146/98  08/16/20 130/80    No need for medication at this time Repeat CMP

## 2021-12-08 NOTE — Assessment & Plan Note (Addendum)
Repeat Tsh and T4: Low tsh but normal T4, decrease levothyroxine to 149mg. Elevated LDL: maintain heart healthy diet Normal renal function Return to lab in 6weeks for repeat TSH and T4

## 2022-03-07 ENCOUNTER — Encounter: Payer: Self-pay | Admitting: Nurse Practitioner

## 2022-03-07 ENCOUNTER — Other Ambulatory Visit: Payer: Self-pay | Admitting: Nurse Practitioner

## 2022-03-07 DIAGNOSIS — E890 Postprocedural hypothyroidism: Secondary | ICD-10-CM

## 2022-03-09 ENCOUNTER — Other Ambulatory Visit: Payer: Self-pay

## 2022-03-09 ENCOUNTER — Telehealth: Payer: Self-pay | Admitting: Nurse Practitioner

## 2022-03-09 DIAGNOSIS — E890 Postprocedural hypothyroidism: Secondary | ICD-10-CM

## 2022-03-09 MED ORDER — LEVOTHYROXINE SODIUM 125 MCG PO TABS: 125.0000 ug | ORAL_TABLET | Freq: Every day | ORAL | 0 refills | Status: DC

## 2022-03-09 NOTE — Telephone Encounter (Signed)
Prescription Request  03/09/2022   LOV: 12/08/2021  What is the name of the medication or equipment? levothyroxine (SYNTHROID) 125 MCG tablet [id number]   Have you contacted your pharmacy to request a refill? Yes, pt has an upcoming app for 03/16/22.  Which pharmacy would you like this sent to?  [id number] [address]: [phone]: [phone]   Patient notified that their request is being sent to the clinical staff for review and that they should receive a response within 2 business days.   Please advise at Mobile [phone](mobile)

## 2022-03-09 NOTE — Telephone Encounter (Signed)
Medication sent- Advised to keep appointment

## 2022-03-16 ENCOUNTER — Ambulatory Visit: Payer: BC Managed Care – PPO | Admitting: Nurse Practitioner

## 2022-03-27 NOTE — Progress Notes (Unsigned)
New patient visit   Patient: Gloria Flynn   DOB: November 25, 1970   52 y.o. Female  MRN: [id number] Visit Date: 03/30/2022  Today's healthcare provider: Eulis Foster, MD   No chief complaint on file.  Subjective    Gloria Flynn is a 52 y.o. female who presents today as a new patient to establish care.  HPI  ***  Past Medical History:  Diagnosis Date   Goiter    Dr. Shawna Orleans in [place]   HPV in female 2009   Keloid    Pregnancy induced hypertension    Urinary tract infection    Past Surgical History:  Procedure Laterality Date   CESAREAN SECTION     THYROID SURGERY  09/22/2013   Family Status  Relation Name Status   Father  (Not Specified)   Neg Hx  (Not Specified)   Family History  Problem Relation Age of Onset   Diabetes Father    Heart disease Father    Anesthesia problems Neg Hx    Social History   Socioeconomic History   Marital status: Married    Spouse name: Not on file   Number of children: Not on file   Years of education: Not on file   Highest education level: Not on file  Occupational History   Not on file  Tobacco Use   Smoking status: Never   Smokeless tobacco: Never  Substance and Sexual Activity   Alcohol use: No   Drug use: No   Sexual activity: Yes    Partners: Male    Birth control/protection: None  Other Topics Concern   Not on file  Social History Narrative   Not on file   Social Determinants of Health   Financial Resource Strain: Not on file  Food Insecurity: Not on file  Transportation Needs: Not on file  Physical Activity: Not on file  Stress: Not on file  Social Connections: Not on file   Outpatient Medications Prior to Visit  Medication Sig   levothyroxine (SYNTHROID) 125 MCG tablet Take 1 tablet (125 mcg total) by mouth daily before breakfast.   Vitamin D, Ergocalciferol, (DRISDOL) 1.25 MG (50000 UNIT) CAPS capsule Take 1 capsule (50,000 Units total) by mouth every 7 (seven) days.   No  facility-administered medications prior to visit.   Allergies  Allergen Reactions   Hydrocodone Other (See Comments)    Dizziness/lightheaded   Tessalon [Benzonatate]     Dizzy, drowsy and h/a    Immunization History  Administered Date(s) Administered   Hepatitis B, ADULT 04/26/2017   Influenza, Quadrivalent, Recombinant, Inj, Pf 10/03/2019   Influenza-Unspecified 09/16/2017   Moderna Sars-Covid-2 Vaccination 08/25/2019, 09/16/2019   Tdap 04/26/2017    Health Maintenance  Topic Date Due   PAP SMEAR-Modifier  09/13/2020   COVID-19 Vaccine (3 - 2023-24 season) 09/05/2021   MAMMOGRAM  03/27/2022   Zoster Vaccines- Shingrix (1 of 2) Never done   INFLUENZA VACCINE  04/05/2022 (Originally 08/05/2021)   Fecal DNA (Cologuard)  08/26/2023   DTaP/Tdap/Td (2 - Td or Tdap) 04/27/2027   Hepatitis C Screening  Completed   HIV Screening  Completed   HPV VACCINES  Aged Out    Patient Care Team: Nche, Charlene Brooke, NP as PCP - General (Internal Medicine)  Review of Systems  {Labs  Heme  Chem  Endocrine  Serology  Results Review (optional):23779}   Objective    There were no vitals taken for this visit. {Show previous vital signs (optional):23777}  Physical Exam ***  Depression Screen    12/08/2021    9:44 AM 08/16/2020    2:23 PM 11/30/2018    7:28 AM 10/26/2016    4:01 PM  PHQ 2/9 Scores  PHQ - 2 Score 0 0 0 0  PHQ- 9 Score 3      No results found for any visits on 03/30/22.  Assessment & Plan     ***  No follow-ups on file.     {provider attestation***:1}   Eulis Foster, MD  [facility] [phone] (phone) [phone] (fax)  [place]

## 2022-03-30 ENCOUNTER — Encounter: Payer: Self-pay | Admitting: Family Medicine

## 2022-03-30 ENCOUNTER — Ambulatory Visit (INDEPENDENT_AMBULATORY_CARE_PROVIDER_SITE_OTHER): Payer: BC Managed Care – PPO | Admitting: Family Medicine

## 2022-03-30 VITALS — BP 139/90 | HR 67 | Temp 98.4°F | Resp 16 | Ht 65.0 in | Wt 298.0 lb

## 2022-03-30 DIAGNOSIS — Z7689 Persons encountering health services in other specified circumstances: Secondary | ICD-10-CM | POA: Insufficient documentation

## 2022-03-30 DIAGNOSIS — N951 Menopausal and female climacteric states: Secondary | ICD-10-CM | POA: Insufficient documentation

## 2022-03-30 DIAGNOSIS — Z6841 Body Mass Index (BMI) 40.0 and over, adult: Secondary | ICD-10-CM | POA: Insufficient documentation

## 2022-03-30 DIAGNOSIS — Z1231 Encounter for screening mammogram for malignant neoplasm of breast: Secondary | ICD-10-CM | POA: Insufficient documentation

## 2022-03-30 DIAGNOSIS — J301 Allergic rhinitis due to pollen: Secondary | ICD-10-CM | POA: Insufficient documentation

## 2022-03-30 DIAGNOSIS — I10 Essential (primary) hypertension: Secondary | ICD-10-CM

## 2022-03-30 DIAGNOSIS — E890 Postprocedural hypothyroidism: Secondary | ICD-10-CM

## 2022-03-30 DIAGNOSIS — E559 Vitamin D deficiency, unspecified: Secondary | ICD-10-CM

## 2022-03-30 HISTORY — DX: Persons encountering health services in other specified circumstances: Z76.89

## 2022-03-30 MED ORDER — FLUTICASONE PROPIONATE 50 MCG/ACT NA SUSP: 2.0000 | Freq: Every day | NASAL | 6 refills | Status: AC

## 2022-03-30 NOTE — Assessment & Plan Note (Signed)
Welcomed patient to [facility]  Reviewed patient's medical history, medications, surgical and social history Discussed roles and expectations for primary care physician-patient relationship Recommended patient schedule annual preventative examinations   

## 2022-03-30 NOTE — Assessment & Plan Note (Signed)
Chronic  Flonase 44mcg, 2 sprays per nostril daily  Demonstrated proper technique for nasal spray

## 2022-03-30 NOTE — Assessment & Plan Note (Signed)
Chronic  Currently on synthroid 166mcg daily  S/p goiter removal  She does not currently follow with endocrinology Will test TSH and free T4 today

## 2022-03-30 NOTE — Assessment & Plan Note (Signed)
Mammogram ordered  Patient given card to schedule mammogram with [facility]   

## 2022-03-30 NOTE — Patient Instructions (Addendum)
It was a pleasure meeting you today!  Welcome to [facility].  I look forward to taking part in your care as your new primary care physician.    Summary of our discussion today:   Please schedule your physical with me in the next 3 months and we will be able to complete for pap smear. We will follow up with results of labs once they are available. We will be collecting labs to test your thyroid levels Please see below for the information to schedule your mammogram  [facility],  [place]  [address] Get Driving Directions Main: [phone]   Best Wishes,   Dr. Quentin Cornwall    Perimenopause Perimenopause is the normal time of a woman's life when the levels of estrogen, the female hormone produced by the ovaries, begin to decrease. This leads to changes in menstrual periods before they stop completely (menopause). Perimenopause can begin 2-8 years before menopause. During perimenopause, the ovaries may or may not produce an egg and a woman can still become pregnant. What are the causes? This condition is caused by a natural change in hormone levels that happens as you get older. What increases the risk? This condition is more likely to start at an earlier age if you have certain medical conditions or have undergone treatments, including: A tumor of the pituitary gland in the brain. A disease that affects the ovaries and hormone production. Certain cancer treatments, such as chemotherapy or hormone therapy, or radiation therapy on the pelvis. Heavy smoking and excessive alcohol use. Family history of early menopause. What are the signs or symptoms? Perimenopausal changes affect each woman differently. Symptoms of this condition may include: Hot flashes. Irregular menstrual periods. Night sweats. Changes in feelings about sex. This could be a decrease in sex drive or an increased discomfort around your  sexuality. Vaginal dryness. Headaches. Mood swings. Depression. Problems sleeping (insomnia). Memory problems or trouble concentrating. Irritability. Tiredness. Weight gain. Anxiety. Trouble getting pregnant. How is this diagnosed? This condition is diagnosed based on your medical history, a physical exam, your age, your menstrual history, and your symptoms. Hormone tests may also be done. How is this treated? In some cases, no treatment is needed. You and your health care provider should make a decision together about whether treatment is necessary. Treatment will be based on your individual condition and preferences. Various treatments are available, such as: Menopausal hormone therapy (MHT). Medicines to treat specific symptoms. Acupuncture. Vitamin or herbal supplements. Before starting treatment, make sure to let your health care provider know if you have a personal or family history of: Heart disease. Breast cancer. Blood clots. Diabetes. Osteoporosis. Follow these instructions at home: Medicines Take over-the-counter and prescription medicines only as told by your health care provider. Take vitamin supplements only as told by your health care provider. Talk with your health care provider before starting any herbal supplements. Lifestyle  Do not use any products that contain nicotine or tobacco, such as cigarettes, e-cigarettes, and chewing tobacco. If you need help quitting, ask your health care provider. Get at least 30 minutes of physical activity on 5 or more days each week. Eat a balanced diet that includes fresh fruits and vegetables, whole grains, soybeans, eggs, lean meat, and low-fat dairy. Avoid alcoholic and caffeinated beverages, as well as spicy foods. This may help prevent hot flashes. Get 7-8 hours of sleep each night. Dress in layers that can be removed to help you manage  hot flashes. Find ways to manage stress, such as deep breathing, meditation, or  journaling. General instructions  Keep track of your menstrual periods, including: When they occur. How heavy they are and how long they last. How much time passes between periods. Keep track of your symptoms, noting when they start, how often you have them, and how long they last. Use vaginal lubricants or moisturizers to help with vaginal dryness and improve comfort during sex. You can still become pregnant if you are having irregular periods. Make sure you use contraception during perimenopause if you do not want to get pregnant. Keep all follow-up visits. This is important. This includes any group therapy or counseling. Contact a health care provider if: You have heavy vaginal bleeding or pass blood clots. Your period lasts more than 2 days longer than normal. Your periods are recurring sooner than 21 days. You bleed after having sex. You have pain during sex. Get help right away if you have: Chest pain, trouble breathing, or trouble talking. Severe depression. Pain when you urinate. Severe headaches. Vision problems. Summary Perimenopause is the time when a woman's body begins to move into menopause. This may happen naturally or as a result of other health problems or medical treatments. Perimenopause can begin 2-8 years before menopause, and it can last for several years. Perimenopausal symptoms can be managed through medicines, lifestyle changes, and complementary therapies such as acupuncture. This information is not intended to replace advice given to you by your health care provider. Make sure you discuss any questions you have with your health care provider. Document Revised: 06/08/2019 Document Reviewed: 06/08/2019 Elsevier Patient Education  [place].

## 2022-03-30 NOTE — Assessment & Plan Note (Signed)
Chronic  BP elevated in office today, patient asymptomatic  Patient manages BP with lifestyle changes  She is planning to resume exercise and dietary changes to help with BP  Will follow up in 3 months

## 2022-03-30 NOTE — Assessment & Plan Note (Addendum)
New onset symptoms  Discussed normal progression of menopause See AVS for handout given

## 2022-03-30 NOTE — Assessment & Plan Note (Signed)
Chronic  Reports being on weekly supplementation  Will plan to check these levels during annual physical  She will continue weekly 50,000unit supplement

## 2022-03-31 LAB — TSH+FREE T4
Free T4: 1.39 ng/dL (ref 0.82–1.77)
TSH: 0.859 u[IU]/mL (ref 0.450–4.500)

## 2022-04-06 ENCOUNTER — Ambulatory Visit: Payer: BC Managed Care – PPO | Admitting: Nurse Practitioner

## 2022-04-08 ENCOUNTER — Other Ambulatory Visit: Payer: Self-pay | Admitting: Nurse Practitioner

## 2022-04-08 DIAGNOSIS — E559 Vitamin D deficiency, unspecified: Secondary | ICD-10-CM

## 2022-06-19 ENCOUNTER — Encounter: Payer: BC Managed Care – PPO | Admitting: Family Medicine

## 2022-08-31 ENCOUNTER — Other Ambulatory Visit: Payer: Self-pay | Admitting: Nurse Practitioner

## 2022-08-31 DIAGNOSIS — E890 Postprocedural hypothyroidism: Secondary | ICD-10-CM

## 2022-09-08 ENCOUNTER — Other Ambulatory Visit: Payer: Self-pay | Admitting: Family Medicine

## 2022-09-08 ENCOUNTER — Other Ambulatory Visit: Payer: Self-pay | Admitting: Nurse Practitioner

## 2022-09-08 ENCOUNTER — Telehealth: Payer: Self-pay | Admitting: Family Medicine

## 2022-09-08 DIAGNOSIS — E890 Postprocedural hypothyroidism: Secondary | ICD-10-CM

## 2022-09-08 MED ORDER — LEVOTHYROXINE SODIUM 125 MCG PO TABS: 125.0000 ug | ORAL_TABLET | Freq: Every day | ORAL | 1 refills | Status: DC

## 2022-09-08 NOTE — Progress Notes (Signed)
Received message from scheduling  Pt intends to contine with primary care at [facility]   Requesting refill synthroid   Refills for 90 days with 1 refill [facility]

## 2022-09-08 NOTE — Telephone Encounter (Signed)
Patient refill request for the following medications:   levothyroxine (SYNTHROID) 125 MCG tablet    Please advise

## 2022-09-08 NOTE — Telephone Encounter (Signed)
Rx filled

## 2022-09-10 ENCOUNTER — Ambulatory Visit: Payer: BC Managed Care – PPO | Admitting: Family Medicine

## 2022-10-14 ENCOUNTER — Ambulatory Visit: Payer: BC Managed Care – PPO | Admitting: Physician Assistant

## 2022-10-14 ENCOUNTER — Ambulatory Visit: Payer: Self-pay

## 2022-10-14 DIAGNOSIS — M25562 Pain in left knee: Secondary | ICD-10-CM | POA: Diagnosis not present

## 2022-10-14 DIAGNOSIS — H40013 Open angle with borderline findings, low risk, bilateral: Secondary | ICD-10-CM | POA: Diagnosis not present

## 2022-10-14 DIAGNOSIS — M79672 Pain in left foot: Secondary | ICD-10-CM | POA: Diagnosis not present

## 2022-10-14 DIAGNOSIS — I10 Essential (primary) hypertension: Secondary | ICD-10-CM | POA: Diagnosis not present

## 2022-10-14 NOTE — Telephone Encounter (Signed)
Message from Lennox Pippins sent at 10/14/2022 11:16 AM EDT  Summary: Fatigue/wants to be checked for diabetes   Patient has called in wanting to schedule an appointment with her PCP for today or this week to discuss and be tested for diabetes after an eye exam she just had. No appt's until next week with PCP, patient is OK with seeing another provider this week, Patient states she is experiencing fatigue.  Patients callback # [phone]         Chief Complaint: chronic fatigue  Symptoms: 100 pound weight gain Frequency: 2 years Pertinent Negatives: Patient denies pain, bleeding, Disposition: [] ED /[] Urgent Care (no appt availability in office) / [] Appointment(In office/virtual)/ []  [facility]/ [] Home Care/ [] Refused Recommended Disposition /[] [facility]/ []  Follow-up with PCP Additional Notes: had eye exam today and has to see ophthalmologist. LMP end August- pt stated she is perimenopausal. Concerned she may have thyroid or diabetes. Pt stated she has a h/o low Vitamin D levels. Would like those labs drawn today if possible.  Reason for Disposition  Fatigue is a chronic symptom (recurrent or ongoing AND present > 4 weeks)  Answer Assessment - Initial Assessment Questions 1. DESCRIPTION: "Describe how you are feeling."     fatigue 2. SEVERITY: "How bad is it?"  "Can you stand and walk?"   - MILD (0-3): Feels weak or tired, but does not interfere with work, school or normal activities.   - MODERATE (4-7): Able to stand and walk; weakness interferes with work, school, or normal activities.   - SEVERE (8-10): Unable to stand or walk; unable to do usual activities.     mild 3. ONSET: "When did these symptoms begin?" (e.g., hours, days, weeks, months)     Fatigue same but had exam exam  4. CAUSE: "What do you think is causing the weakness or fatigue?" (e.g., not drinking enough fluids, medical problem, trouble sleeping)     diabetes 5. NEW MEDICINES:  "Have you  started on any new medicines recently?" (e.g., opioid pain medicines, benzodiazepines, muscle relaxants, antidepressants, antihistamines, neuroleptics, beta blockers)     no 6. OTHER SYMPTOMS: "Do you have any other symptoms?" (e.g., chest pain, fever, cough, SOB, vomiting, diarrhea, bleeding, other areas of pain)     no 7. PREGNANCY: "Is there any chance you are pregnant?" "When was your last menstrual period?"     Perimenopause LMP End of August  Protocols used: Weakness (Generalized) and Fatigue-A-AH

## 2022-10-14 NOTE — Progress Notes (Deleted)
  Established patient visit  Patient: Gloria Flynn   DOB: 11-11-1970   52 y.o. Female  MRN: [id number] Visit Date: 10/14/2022  Today's healthcare provider: Debera Lat, PA-C   No chief complaint on file.  Subjective    HPI  *** Discussed the use of AI scribe software for clinical note transcription with the patient, who gave verbal consent to proceed.  History of Present Illness               03/30/2022    9:17 AM 12/08/2021    9:44 AM 08/16/2020    2:23 PM  Depression screen PHQ 2/9  Decreased Interest 1 0 0  Down, Depressed, Hopeless 0 0 0  PHQ - 2 Score 1 0 0  Altered sleeping 1 0   Tired, decreased energy 1 2   Change in appetite 0 1   Feeling bad or failure about yourself  0 0   Trouble concentrating 0 0   Moving slowly or fidgety/restless 0 0   Suicidal thoughts 0 0   PHQ-9 Score 3 3   Difficult doing work/chores Not difficult at all Not difficult at all       12/08/2021    9:45 AM 08/16/2020    2:23 PM  GAD 7 : Generalized Anxiety Score  Nervous, Anxious, on Edge 0 0  Control/stop worrying 0 0  Worry too much - different things 1 0  Trouble relaxing 0 0  Restless 0 0  Easily annoyed or irritable 0 0  Afraid - awful might happen 0 0  Total GAD 7 Score 1 0  Anxiety Difficulty Not difficult at all     Medications: Outpatient Medications Prior to Visit  Medication Sig   fluticasone (FLONASE) 50 MCG/ACT nasal spray Place 2 sprays into both nostrils daily.   levothyroxine (SYNTHROID) 125 MCG tablet Take 1 tablet (125 mcg total) by mouth daily before breakfast.   No facility-administered medications prior to visit.    Review of Systems Except see HPI   {Insert previous labs (optional):23779} {See past labs  Heme  Chem  Endocrine  Serology  Results Review (optional):1}   Objective    There were no vitals taken for this visit. {Insert last BP/Wt (optional):23777}{See vitals history (optional):1}   Physical Exam   No results found for any  visits on 10/14/22.  Assessment & Plan                  No follow-ups on file.     The patient was advised to call back or seek an in-person evaluation if the symptoms worsen or if the condition fails to improve as anticipated.  I discussed the assessment and treatment plan with the patient. The patient was provided an opportunity to ask questions and all were answered. The patient agreed with the plan and demonstrated an understanding of the instructions.  I, Debera Lat, PA-C have reviewed all documentation for this visit. The documentation on  10/14/22 for the exam, diagnosis, procedures, and orders are all accurate and complete.  Debera Lat, [facility], MMS [facility] [phone] (phone) [phone] (fax)  [facility]

## 2023-03-03 ENCOUNTER — Other Ambulatory Visit: Payer: Self-pay | Admitting: Family Medicine

## 2023-03-03 DIAGNOSIS — E890 Postprocedural hypothyroidism: Secondary | ICD-10-CM

## 2023-03-03 NOTE — Telephone Encounter (Signed)
 Requested Prescriptions  Pending Prescriptions Disp Refills   levothyroxine (SYNTHROID) 125 MCG tablet [Pharmacy Med Name: LEVOTHYROXINE 125 MCG TABLET] 90 tablet 0    Sig: TAKE 1 TABLET BY MOUTH DAILY BEFORE BREAKFAST.     Endocrinology:  Hypothyroid Agents Passed - 03/03/2023  3:27 PM      Passed - TSH in normal range and within 360 days    TSH  Date Value Ref Range Status  03/30/2022 0.859 0.450 - 4.500 uIU/mL Final         Passed - Valid encounter within last 12 months    Recent Outpatient Visits           11 months ago Primary hypertension    [facility], [place], MD   7 years ago Viral URI with cough   [facility], Binnie Rail, FNP   8 years ago Acute bronchitis, unspecified organism   [facility], Tessa Lerner, MD   8 years ago Bilateral conjunctivitis   [facility] at [facility], Deanna M, DO

## 2023-06-04 ENCOUNTER — Other Ambulatory Visit: Payer: Self-pay | Admitting: Family Medicine

## 2023-06-04 DIAGNOSIS — E890 Postprocedural hypothyroidism: Secondary | ICD-10-CM

## 2023-06-04 NOTE — Telephone Encounter (Signed)
 Copied from CRM [id number]. Topic: Clinical - Medication Refill >> Jun 04, 2023  3:01 PM Phil Braun wrote: Medication: levothyroxine  (SYNTHROID ) 125 MCG tablet  Has the patient contacted their pharmacy? Yes  This is the patient's preferred pharmacy:  [id number] [place] [place] - [address] [place] [address] Phone: [phone] Fax: [phone]  Is this the correct pharmacy for this prescription? Yes If no, delete pharmacy and type the correct one.   Has the prescription been filled recently? Yes  Is the patient out of the medication? Yes  Has the patient been seen for an appointment in the last year OR does the patient have an upcoming appointment? Yes  Can we respond through MyChart? Yes  Agent: Please be advised that Rx refills may take up to 3 business days. We ask that you follow-up with your pharmacy.

## 2023-06-07 NOTE — Telephone Encounter (Signed)
 Copied from CRM [id number]. Topic: Clinical - Prescription Issue >> Jun 07, 2023 11:09 AM Tiffany S wrote: Reason for CRM: Please follow up with patient regarding thyroid  prescription

## 2023-06-07 NOTE — Telephone Encounter (Signed)
 Requested medications are due for refill today.  yes  Requested medications are on the active medications list.  yes  Last refill. 03/03/2023  Future visit scheduled.   no  Notes to clinic.  Labs are expired.    Requested Prescriptions  Pending Prescriptions Disp Refills   levothyroxine  (SYNTHROID ) 125 MCG tablet 90 tablet 0    Sig: Take 1 tablet (125 mcg total) by mouth daily before breakfast.     Endocrinology:  Hypothyroid Agents Failed - 06/07/2023 10:31 AM      Failed - TSH in normal range and within 360 days    TSH  Date Value Ref Range Status  03/30/2022 0.859 0.450 - 4.500 uIU/mL Final         Failed - Valid encounter within last 12 months    Recent Outpatient Visits           8 years ago Viral URI with cough   [facility], Rexine Cater, FNP   8 years ago Acute bronchitis, unspecified organism   [facility], Gaylord Kearns, MD   9 years ago Bilateral conjunctivitis   [facility] at [facility], Deanna M, DO

## 2023-06-08 ENCOUNTER — Telehealth: Payer: Self-pay

## 2023-06-08 NOTE — Telephone Encounter (Signed)
 Copied from CRM [id number]. Topic: Clinical - Prescription Issue >> Jun 08, 2023  7:49 AM Carlatta H wrote: Reason for CRM: Patient would like a message sent via mychart once medication is sent to the pharmacy//levothyroxine  (SYNTHROID ) 125 MCG tablet [[id number]]

## 2023-06-09 ENCOUNTER — Ambulatory Visit (INDEPENDENT_AMBULATORY_CARE_PROVIDER_SITE_OTHER): Admitting: Family Medicine

## 2023-06-09 ENCOUNTER — Encounter: Payer: Self-pay | Admitting: Family Medicine

## 2023-06-09 ENCOUNTER — Other Ambulatory Visit (HOSPITAL_COMMUNITY)
Admission: RE | Admit: 2023-06-09 | Discharge: 2023-06-09 | Disposition: A | Discharge status: Home / Self Care | Source: Ambulatory Visit | Attending: Family Medicine | Admitting: Family Medicine

## 2023-06-09 VITALS — BP 139/89 | HR 75 | Ht 64.0 in | Wt 308.5 lb

## 2023-06-09 DIAGNOSIS — Z23 Encounter for immunization: Secondary | ICD-10-CM

## 2023-06-09 DIAGNOSIS — Z113 Encounter for screening for infections with a predominantly sexual mode of transmission: Secondary | ICD-10-CM | POA: Diagnosis not present

## 2023-06-09 DIAGNOSIS — E890 Postprocedural hypothyroidism: Secondary | ICD-10-CM | POA: Diagnosis not present

## 2023-06-09 DIAGNOSIS — I10 Essential (primary) hypertension: Secondary | ICD-10-CM | POA: Diagnosis not present

## 2023-06-09 DIAGNOSIS — E559 Vitamin D deficiency, unspecified: Secondary | ICD-10-CM | POA: Diagnosis not present

## 2023-06-09 DIAGNOSIS — Z124 Encounter for screening for malignant neoplasm of cervix: Secondary | ICD-10-CM | POA: Insufficient documentation

## 2023-06-09 DIAGNOSIS — Z1231 Encounter for screening mammogram for malignant neoplasm of breast: Secondary | ICD-10-CM | POA: Diagnosis not present

## 2023-06-09 NOTE — Progress Notes (Signed)
 Established patient visit   Patient: Gloria Flynn   DOB: 14-Nov-1970   53 y.o. Female  MRN: [id number] Visit Date: 06/09/2023  Today's healthcare provider: Mimi Alt, MD   Chief Complaint  Patient presents with   Medical Management of Chronic Issues   Hypothyroidism    She was last seen for hypothyroid 03/30/22  Management since that visit includes continue current regimen. She reports excellent compliance with treatment. She is not having side effects.  Symptoms: change in energy level, palpitations and weight changes    Subjective     HPI     Hypothyroidism    Additional comments: She was last seen for hypothyroid 03/30/22  Management since that visit includes continue current regimen. She reports excellent compliance with treatment. She is not having side effects.  Symptoms: change in energy level, palpitations and weight changes       Last edited by Pasty Bongo, CMA on 06/09/2023  9:12 AM.       Discussed the use of AI scribe software for clinical note transcription with the patient, who gave verbal consent to proceed.  History of Present Illness Gloria Flynn is a 53 year old female with hypothyroidism and chronic hypertension who presents for management of her conditions.  She has a history of hypothyroidism and is currently taking Synthroid  125 mcg daily. Thyroid  function tests have been ordered to assess her current thyroid  levels.  She has chronic hypertension with a blood pressure reading of 172/94 during the visit, although she reported a home reading of 139/80. She is not currently on any antihypertensive medications. She has gained approximately 20 pounds since March, attributed to dietary changes, including increased pasta consumption. She is attempting to improve her diet and increase physical activity, recalling a time when she lost 32 pounds through regular exercise and a healthy diet.  She prefers holistic and herbal treatments  and is hesitant about conventional medical screenings and vaccinations. She has declined the shingles vaccine and is considering alternatives to traditional mammograms and Pap smears.  She has a history of chickenpox in childhood and has experienced symptoms consistent with perimenopause, including irregular menstrual cycles since December. She is exploring self-administered HPV and STI screenings.  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 She has previously followed a gluten-free diet and is considering returning to it due to suspected gluten sensitivity.     Past Medical History:  Diagnosis Date   Encounter to establish care 03/30/2022   Goiter    Dr. Trudi Fus in [place]   HPV in female 01/06/2007   Keloid    Pregnancy induced hypertension    Urinary tract infection     Medications: Outpatient Medications Prior to Visit  Medication Sig   fluticasone  (FLONASE ) 50 MCG/ACT nasal spray Place 2 sprays into both nostrils daily.   levothyroxine  (SYNTHROID ) 125 MCG tablet TAKE 1 TABLET BY MOUTH DAILY BEFORE BREAKFAST.   No facility-administered medications prior to visit.    Review of Systems  Last metabolic panel Lab Results  Component Value Date   GLUCOSE 90 12/08/2021   NA 137 12/08/2021   K 4.0 12/08/2021   CL 103 12/08/2021   CO2 27 12/08/2021   BUN 9 12/08/2021   CREATININE 0.88 12/08/2021   GFR 77.02 12/08/2021   CALCIUM  8.5 12/08/2021   PROT 7.0 12/08/2021   ALBUMIN 4.1 12/08/2021   BILITOT 0.3 12/08/2021   ALKPHOS 67 12/08/2021  AST 18 12/08/2021   ALT 23 12/08/2021   ANIONGAP 11 09/11/2016   Last lipids Lab Results  Component Value Date   CHOL 194 12/08/2021   HDL 58.70 12/08/2021   LDLCALC 120 (H) 12/08/2021   LDLDIRECT 119.0 08/04/2021   TRIG 73.0 12/08/2021   CHOLHDL 3 12/08/2021   Last hemoglobin A1c Lab Results  Component Value Date   HGBA1C 5.5 05/09/2013   Last  thyroid  functions Lab Results  Component Value Date   TSH 0.859 03/30/2022   T3TOTAL 108 11/28/2018   Last vitamin D  Lab Results  Component Value Date   VD25OH 27.73 (L) 12/08/2021         Objective    BP 139/89 Comment: home reading  Pulse 75   Ht 5\' 4"  (1.626 m)   Wt (!) 308 lb 8 oz (139.9 kg)   SpO2 100%   BMI 52.95 kg/m  BP Readings from Last 3 Encounters:  06/09/23 139/89  03/30/22 (!) 139/90  12/08/21 128/82   Wt Readings from Last 3 Encounters:  06/09/23 (!) 308 lb 8 oz (139.9 kg)  03/30/22 298 lb (135.2 kg)  12/08/21 288 lb 9.6 oz (130.9 kg)        Physical Exam  General: Alert, no acute distress Cardio: Normal S1 and S2, RRR, no r/m/g Pulm: CTAB, normal work of breathing   No results found for any visits on 06/09/23.   Assessment & Plan     Problem List Items Addressed This Visit       Cardiovascular and Mediastinum   HTN (hypertension)     Endocrine   Hypothyroidism - Primary   Relevant Orders   TSH+T4F+T3Free     Other   Vitamin D  deficiency   Relevant Orders   VITAMIN D  25 Hydroxy (Vit-D Deficiency, Fractures)   Encounter for screening mammogram for malignant neoplasm of breast   Other Visit Diagnoses       Need for shingles vaccine       Relevant Orders   Zoster Recombinant (Shingrix )     Cervical cancer screening       Relevant Orders   Cytology - PAP       Assessment & Plan Hypertension Chronic hypertension with office blood pressure of 172/94 and home readings of 139/80. No current antihypertensive medications. Prefers holistic approaches and home monitoring before medication initiation. Discussed risks of untreated hypertension, including heart failure and cardiac remodeling. Emphasized achieving target blood pressure of 120/70 to prevent complications. - Monitor blood pressure at home and keep a record - Schedule follow-up in 6 weeks to review blood pressure records and response to lifestyle  changes  Hypothyroidism Chronic condition managed with Synthroid  125 mcg daily. Thyroid  function tests (TSH, T4, T3) ordered to assess current thyroid  levels. - Continue Synthroid  125 mcg daily - Order TSH, T4, T3 tests to assess thyroid  function  Perimenopause Irregular menstrual cycles with missed periods from January to March, resumed in April, and absent in May. Discussed transition phase of perimenopause leading to menopause.  Enlarged salivary gland Enlargement on the left side, possibly an enlarged salivary gland. Discussed potential referral to an ENT specialist for further evaluation. - Consider referral to ENT specialist for evaluation of enlarged salivary gland  General Health Maintenance Discussed breast cancer screening with mammogram, but she prefers to consider it further due to preference for holistic approaches. Declined shingles vaccine due to preference for holistic methods. Explained that the shingles vaccine is a two-dose series that can prevent complications  such as postherpetic neuralgia and vision-threatening complications. Opted for self-swab for HPV and STI screening. - Order mammogram for breast cancer screening if she decides to proceed - Administer self-swab for HPV and STI screening including gonorrhea, chlamydia, and trichomonas - Order vitamin D  level test  Follow-up Plan to reassess blood pressure management and thyroid  function in follow-up visit. - Schedule follow-up appointment in 6 weeks to review blood pressure and thyroid  function tests     No follow-ups on file.         Mimi Alt, MD  [facility] [phone] (phone) [phone] (fax)  [facility]

## 2023-06-10 ENCOUNTER — Other Ambulatory Visit: Payer: Self-pay | Admitting: Family Medicine

## 2023-06-10 ENCOUNTER — Telehealth: Payer: Self-pay

## 2023-06-10 ENCOUNTER — Ambulatory Visit: Payer: Self-pay | Admitting: Family Medicine

## 2023-06-10 DIAGNOSIS — E890 Postprocedural hypothyroidism: Secondary | ICD-10-CM

## 2023-06-10 DIAGNOSIS — E559 Vitamin D deficiency, unspecified: Secondary | ICD-10-CM

## 2023-06-10 LAB — VITAMIN D 25 HYDROXY (VIT D DEFICIENCY, FRACTURES): Vit D, 25-Hydroxy: 16 ng/mL — ABNORMAL LOW (ref 30.0–100.0)

## 2023-06-10 LAB — TSH+T4F+T3FREE
Free T4: 1.28 ng/dL (ref 0.82–1.77)
T3, Free: 2.3 pg/mL (ref 2.0–4.4)
TSH: 1.04 u[IU]/mL (ref 0.450–4.500)

## 2023-06-10 MED ORDER — VITAMIN D (ERGOCALCIFEROL) 1.25 MG (50000 UNIT) PO CAPS: 50000.0000 [IU] | ORAL_CAPSULE | ORAL | 0 refills | Status: AC

## 2023-06-10 NOTE — Telephone Encounter (Signed)
 Art Bigness from [facility] called requesting to speak with Dr.[facility] or her nurse. I called Gloria back. Art Bigness from cytology stated Cervicovaginal ancillary test had been ordered as a swab and could only test swab for GC/Chlamydia/Trich. Stated the can not process the HPV test as that has to be done only through PAP Smear. If you would like the HPV test pt will need to come back to be test. FYI, please advise if you would like HPV swab done.

## 2023-06-10 NOTE — Telephone Encounter (Signed)
 Reviewed. The available results are ok with HPV. No additional collection at this time, pt declines pap smear but was willing to do swab

## 2023-06-11 ENCOUNTER — Telehealth: Payer: Self-pay | Admitting: Family Medicine

## 2023-06-11 LAB — CERVICOVAGINAL ANCILLARY ONLY
Chlamydia: NEGATIVE
Comment: NEGATIVE
Comment: NEGATIVE
Comment: NORMAL
Neisseria Gonorrhea: NEGATIVE
Trichomonas: NEGATIVE

## 2023-06-11 NOTE — Telephone Encounter (Signed)
 Duplicate request

## 2023-06-11 NOTE — Telephone Encounter (Signed)
 Last OV 06/09/23 and future OV scheduled, due for refill.  Requested Prescriptions  Pending Prescriptions Disp Refills   levothyroxine  (SYNTHROID ) 125 MCG tablet [Pharmacy Med Name: LEVOTHYROXINE  125 MCG TABLET] 90 tablet 0    Sig: TAKE 1 TABLET BY MOUTH EVERY DAY BEFORE BREAKFAST     Endocrinology:  Hypothyroid Agents Failed - 06/11/2023  2:59 PM      Failed - Valid encounter within last 12 months    Recent Outpatient Visits           2 days ago Postoperative hypothyroidism   [facility] Simmons-Robinson, [place], MD   8 years ago Viral URI with cough   [facility], Rexine Cater, FNP   8 years ago Acute bronchitis, unspecified organism   [facility], Gaylord Kearns, MD   9 years ago Bilateral conjunctivitis   [facility] at [facility], Deanna M, DO              Passed - TSH in normal range and within 360 days    TSH  Date Value Ref Range Status  06/09/2023 1.040 0.450 - 4.500 uIU/mL Final

## 2023-06-11 NOTE — Telephone Encounter (Signed)
 Copied from CRM [id number]. Topic: Clinical - Medication Refill >> Jun 11, 2023 10:20 AM Allyne Areola wrote: Medication: levothyroxine  (SYNTHROID ) 125 MCG tablet [[id number]]  Has the patient contacted their pharmacy? No (Agent: If no, request that the patient contact the pharmacy for the refill. If patient does not wish to contact the pharmacy document the reason why and proceed with request.) (Agent: If yes, when and what did the pharmacy advise?)  This is the patient's preferred pharmacy:  [id number] [place], [place] - [address] [place] [address] Phone: [phone] Fax: [phone]  Is this the correct pharmacy for this prescription? Yes If no, delete pharmacy and type the correct one.   Has the prescription been filled recently? No  Is the patient out of the medication? Yes  Has the patient been seen for an appointment in the last year OR does the patient have an upcoming appointment? Yes  Can we respond through MyChart? Yes  Agent: Please be advised that Rx refills may take up to 3 business days. We ask that you follow-up with your pharmacy.

## 2023-07-21 ENCOUNTER — Ambulatory Visit: Admitting: Family Medicine

## 2023-09-05 ENCOUNTER — Other Ambulatory Visit: Payer: Self-pay | Admitting: Family Medicine

## 2023-09-05 DIAGNOSIS — E890 Postprocedural hypothyroidism: Secondary | ICD-10-CM
